# Patient Record
Sex: Female | Born: 1940 | Race: White | Hispanic: No | Marital: Married | Smoking: Never smoker
Health system: Southern US, Community
[De-identification: ages and names within clinical notes are randomized; demographics above are authoritative.]

## PROBLEM LIST (undated history)

## (undated) DIAGNOSIS — K635 Polyp of colon: Secondary | ICD-10-CM

## (undated) DIAGNOSIS — I7300 Raynaud's syndrome without gangrene: Secondary | ICD-10-CM

## (undated) DIAGNOSIS — T7840XA Allergy, unspecified, initial encounter: Secondary | ICD-10-CM

## (undated) DIAGNOSIS — E079 Disorder of thyroid, unspecified: Secondary | ICD-10-CM

## (undated) DIAGNOSIS — L409 Psoriasis, unspecified: Secondary | ICD-10-CM

## (undated) DIAGNOSIS — E669 Obesity, unspecified: Secondary | ICD-10-CM

## (undated) DIAGNOSIS — M1990 Unspecified osteoarthritis, unspecified site: Secondary | ICD-10-CM

## (undated) HISTORY — DX: Disorder of thyroid, unspecified: E07.9

## (undated) HISTORY — DX: Obesity, unspecified: E66.9

## (undated) HISTORY — DX: Allergy, unspecified, initial encounter: T78.40XA

## (undated) HISTORY — PX: CHOLECYSTECTOMY: SHX55

## (undated) HISTORY — DX: Polyp of colon: K63.5

## (undated) HISTORY — PX: OTHER SURGICAL HISTORY: SHX169

## (undated) HISTORY — DX: Raynaud's syndrome without gangrene: I73.00

## (undated) HISTORY — DX: Psoriasis, unspecified: L40.9

---

## 1983-11-14 HISTORY — PX: ABDOMINAL HYSTERECTOMY: SHX81

## 2000-09-21 ENCOUNTER — Encounter: Payer: Self-pay | Admitting: Family Medicine

## 2000-09-21 ENCOUNTER — Encounter: Admission: RE | Admit: 2000-09-21 | Discharge: 2000-09-21 | Payer: Self-pay | Admitting: Family Medicine

## 2000-09-25 LAB — HM PAP SMEAR: HM Pap smear: NORMAL

## 2002-02-07 ENCOUNTER — Encounter: Admission: RE | Admit: 2002-02-07 | Discharge: 2002-02-07 | Payer: Self-pay | Admitting: Family Medicine

## 2002-02-07 ENCOUNTER — Encounter: Payer: Self-pay | Admitting: Family Medicine

## 2004-01-13 ENCOUNTER — Encounter: Admission: RE | Admit: 2004-01-13 | Discharge: 2004-01-13 | Payer: Self-pay | Admitting: Internal Medicine

## 2005-10-31 ENCOUNTER — Encounter: Admission: RE | Admit: 2005-10-31 | Discharge: 2005-10-31 | Payer: Self-pay | Admitting: Internal Medicine

## 2005-11-07 ENCOUNTER — Encounter (INDEPENDENT_AMBULATORY_CARE_PROVIDER_SITE_OTHER): Payer: Self-pay | Admitting: Specialist

## 2005-11-07 ENCOUNTER — Other Ambulatory Visit: Admission: RE | Admit: 2005-11-07 | Discharge: 2005-11-07 | Payer: Self-pay | Admitting: Interventional Radiology

## 2005-11-07 ENCOUNTER — Encounter: Admission: RE | Admit: 2005-11-07 | Discharge: 2005-11-07 | Payer: Self-pay | Admitting: Internal Medicine

## 2009-03-04 ENCOUNTER — Ambulatory Visit: Payer: Self-pay | Admitting: Family Medicine

## 2009-03-09 ENCOUNTER — Ambulatory Visit: Payer: Self-pay | Admitting: Family Medicine

## 2009-04-01 ENCOUNTER — Ambulatory Visit: Payer: Self-pay | Admitting: Family Medicine

## 2009-04-06 ENCOUNTER — Ambulatory Visit: Payer: Self-pay | Admitting: Family Medicine

## 2009-06-22 ENCOUNTER — Ambulatory Visit: Payer: Self-pay | Admitting: Family Medicine

## 2009-06-29 LAB — HM MAMMOGRAPHY: HM Mammogram: NEGATIVE

## 2009-08-17 ENCOUNTER — Ambulatory Visit: Payer: Self-pay | Admitting: Family Medicine

## 2009-08-26 LAB — HM COLONOSCOPY

## 2009-11-16 ENCOUNTER — Ambulatory Visit: Payer: Self-pay | Admitting: Family Medicine

## 2010-03-09 ENCOUNTER — Encounter: Admission: RE | Admit: 2010-03-09 | Discharge: 2010-03-09 | Payer: Self-pay | Admitting: Family Medicine

## 2010-03-09 ENCOUNTER — Ambulatory Visit: Payer: Self-pay | Admitting: Family Medicine

## 2010-04-12 ENCOUNTER — Ambulatory Visit: Payer: Self-pay | Admitting: Family Medicine

## 2010-08-04 ENCOUNTER — Ambulatory Visit: Payer: Self-pay | Admitting: Family Medicine

## 2010-08-30 ENCOUNTER — Ambulatory Visit: Payer: Self-pay | Admitting: Family Medicine

## 2010-09-13 ENCOUNTER — Ambulatory Visit: Payer: Self-pay | Admitting: Family Medicine

## 2010-11-29 ENCOUNTER — Ambulatory Visit
Admission: RE | Admit: 2010-11-29 | Discharge: 2010-11-29 | Payer: Self-pay | Source: Home / Self Care | Attending: Family Medicine | Admitting: Family Medicine

## 2011-05-24 ENCOUNTER — Encounter: Payer: Self-pay | Admitting: Family Medicine

## 2011-05-25 ENCOUNTER — Encounter: Payer: Self-pay | Admitting: Family Medicine

## 2011-05-25 ENCOUNTER — Ambulatory Visit (INDEPENDENT_AMBULATORY_CARE_PROVIDER_SITE_OTHER): Payer: Medicare Other | Admitting: Family Medicine

## 2011-05-25 VITALS — BP 114/80 | HR 82 | Wt 196.0 lb

## 2011-05-25 DIAGNOSIS — H6092 Unspecified otitis externa, left ear: Secondary | ICD-10-CM

## 2011-05-25 DIAGNOSIS — L409 Psoriasis, unspecified: Secondary | ICD-10-CM | POA: Insufficient documentation

## 2011-05-25 DIAGNOSIS — H60399 Other infective otitis externa, unspecified ear: Secondary | ICD-10-CM

## 2011-05-25 DIAGNOSIS — I7300 Raynaud's syndrome without gangrene: Secondary | ICD-10-CM | POA: Insufficient documentation

## 2011-05-25 DIAGNOSIS — J301 Allergic rhinitis due to pollen: Secondary | ICD-10-CM | POA: Insufficient documentation

## 2011-05-25 DIAGNOSIS — E039 Hypothyroidism, unspecified: Secondary | ICD-10-CM | POA: Insufficient documentation

## 2011-05-25 MED ORDER — NEOMYCIN-POLYMYXIN-HC 3.5-10000-1 OT SUSP: 3.0000 [drp] | Freq: Three times a day (TID) | OTIC | Status: AC

## 2011-05-25 NOTE — Patient Instructions (Signed)
Use this medicine for least a week. If you have trouble later on you can use it to help prevent or use and alcohol vinegar combination

## 2011-05-25 NOTE — Progress Notes (Signed)
  Subjective:    Patient ID: Kathy Freeman, female    DOB: [birth year], 70 y.o.   MRN: [id number]  HPI She is here for consult concerning difficulty with left ear discomfort. She does use a hearing aid in this which does fit in the canal. She has noted some drainage and apparently has also seen some blood.   Review of Systems     Objective:   Physical Exam The left canal is swollen with debris present. The TM could not be seen.       Assessment & Plan:  Left otitis externa I will treat with Cortisporin otic. She will do this for the next week. Encouraged her to use a combination of alcohol and vinegar in the future to help keep her ear clean and not use Q-tips.

## 2011-06-11 ENCOUNTER — Other Ambulatory Visit: Payer: Self-pay | Admitting: Family Medicine

## 2011-07-19 ENCOUNTER — Telehealth: Payer: Self-pay | Admitting: Family Medicine

## 2011-07-19 NOTE — Telephone Encounter (Signed)
Okay to refer? 

## 2011-07-19 NOTE — Telephone Encounter (Signed)
PT WALKED IN AND SAID SHE WAYS HAVING ISSUES SEEING OUT OF HER EYE JUST WENT TO [facility] AND WASN'T HAPPY WITH WHAT THEY SAID AND WANTS A REFERRAL TO THE SAME EYE DOC WE SENT HER HUSBAND CHARLES TO.  SHE SAID IT WAS ON [address]  LOOKED IN PT CHART AND SAW REFERRAL BUT WAS UNABLE TO DETERMINE EXACTLY WHERE WE SENT HIM PLEASE CALL PT WITH REFERRAL

## 2011-07-20 ENCOUNTER — Telehealth: Payer: Self-pay

## 2011-07-20 NOTE — Telephone Encounter (Signed)
Called Dr.Gould to get pt eye exam has appt Monday 17 at 11 am  Called pt to inform

## 2011-09-12 ENCOUNTER — Other Ambulatory Visit: Payer: Self-pay | Admitting: Internal Medicine

## 2011-09-12 ENCOUNTER — Other Ambulatory Visit: Payer: Self-pay | Admitting: Family Medicine

## 2011-09-12 ENCOUNTER — Telehealth: Payer: Self-pay | Admitting: Family Medicine

## 2011-09-12 MED ORDER — LEVOTHYROXINE SODIUM 100 MCG PO TABS: 100.0000 ug | ORAL_TABLET | Freq: Every day | ORAL | Status: DC

## 2011-09-12 NOTE — Telephone Encounter (Signed)
Note send.

## 2011-09-12 NOTE — Telephone Encounter (Signed)
Is this okay to refill? 

## 2011-09-12 NOTE — Telephone Encounter (Signed)
Renew her Levoxyl for one month and have her come in for medication check

## 2011-09-12 NOTE — Telephone Encounter (Signed)
Has an appt tomorrow for follow up on med check

## 2011-09-13 ENCOUNTER — Encounter: Payer: Self-pay | Admitting: Family Medicine

## 2011-09-13 ENCOUNTER — Ambulatory Visit (INDEPENDENT_AMBULATORY_CARE_PROVIDER_SITE_OTHER): Payer: Medicare Other | Admitting: Family Medicine

## 2011-09-13 DIAGNOSIS — E039 Hypothyroidism, unspecified: Secondary | ICD-10-CM

## 2011-09-13 DIAGNOSIS — I7300 Raynaud's syndrome without gangrene: Secondary | ICD-10-CM

## 2011-09-13 DIAGNOSIS — Z79899 Other long term (current) drug therapy: Secondary | ICD-10-CM

## 2011-09-13 DIAGNOSIS — M1990 Unspecified osteoarthritis, unspecified site: Secondary | ICD-10-CM

## 2011-09-13 DIAGNOSIS — M129 Arthropathy, unspecified: Secondary | ICD-10-CM

## 2011-09-13 LAB — CBC WITH DIFFERENTIAL/PLATELET
Basophils Absolute: 0 10*3/uL (ref 0.0–0.1)
Basophils Relative: 1 % (ref 0–1)
Eosinophils Absolute: 0.3 10*3/uL (ref 0.0–0.7)
Eosinophils Relative: 4 % (ref 0–5)
HCT: 43.9 % (ref 36.0–46.0)
Hemoglobin: 13.8 g/dL (ref 12.0–15.0)
Lymphocytes Relative: 25 % (ref 12–46)
Lymphs Abs: 1.6 10*3/uL (ref 0.7–4.0)
MCH: 28.1 pg (ref 26.0–34.0)
MCHC: 31.4 g/dL (ref 30.0–36.0)
MCV: 89.4 fL (ref 78.0–100.0)
Monocytes Absolute: 0.5 10*3/uL (ref 0.1–1.0)
Monocytes Relative: 8 % (ref 3–12)
Neutro Abs: 4 10*3/uL (ref 1.7–7.7)
Neutrophils Relative %: 62 % (ref 43–77)
Platelets: 302 10*3/uL (ref 150–400)
RBC: 4.91 MIL/uL (ref 3.87–5.11)
RDW: 13.8 % (ref 11.5–15.5)
WBC: 6.4 10*3/uL (ref 4.0–10.5)

## 2011-09-13 LAB — COMPREHENSIVE METABOLIC PANEL
ALT: 11 U/L (ref 0–35)
AST: 18 U/L (ref 0–37)
Albumin: 4.4 g/dL (ref 3.5–5.2)
Alkaline Phosphatase: 98 U/L (ref 39–117)
BUN: 20 mg/dL (ref 6–23)
CO2: 27 mEq/L (ref 19–32)
Calcium: 9.4 mg/dL (ref 8.4–10.5)
Chloride: 103 mEq/L (ref 96–112)
Creat: 0.76 mg/dL (ref 0.50–1.10)
Glucose, Bld: 65 mg/dL — ABNORMAL LOW (ref 70–99)
Potassium: 5.2 mEq/L (ref 3.5–5.3)
Sodium: 140 mEq/L (ref 135–145)
Total Bilirubin: 0.5 mg/dL (ref 0.3–1.2)
Total Protein: 6.9 g/dL (ref 6.0–8.3)

## 2011-09-13 LAB — TSH: TSH: 0.849 u[IU]/mL (ref 0.350–4.500)

## 2011-09-13 NOTE — Progress Notes (Signed)
  Subjective:    Patient ID: Kathy Freeman, female    DOB: [birth year], 70 y.o.   MRN: [id number]  HPI She is here for medication recheck. She has not had blood work done in over a year. She does have hypothyroidism continues on Levoxyl. He has had no skin, hair changes, cold intolerance or constipation. Her allergies are under good control. She does take Celebrex daily for her arthritis and does get followup with Dr. Ethelene Hal for this. She is not having any difficulty with her Raynaud's disease. She has had a flu shot  Review of Systems     Objective:   Physical Exam alert and in no distress. Tympanic membranes and canals are normal. Throat is clear. Tonsils are normal. Neck is supple without adenopathy or thyromegaly. Cardiac exam shows a regular sinus rhythm without murmurs or gallops. Lungs are clear to auscultation. Skin normal. DTRs normal.        Assessment & Plan:   1. Hypothyroid  TSH  2. Arthritis    3. Encounter for long-term (current) use of other medications  TSH, CBC with Differential, Comprehensive metabolic panel

## 2011-09-14 MED ORDER — LEVOTHYROXINE SODIUM 100 MCG PO TABS: 100.0000 ug | ORAL_TABLET | Freq: Every day | ORAL | Status: DC

## 2011-09-14 NOTE — Progress Notes (Signed)
Addended by: Ronnald Nian on: 09/14/2011 08:34 AM   Modules accepted: Orders

## 2011-10-26 ENCOUNTER — Telehealth: Payer: Self-pay | Admitting: Family Medicine

## 2011-10-26 NOTE — Telephone Encounter (Signed)
The blood work was done in November. Check with Tammy on this.

## 2011-11-16 ENCOUNTER — Telehealth: Payer: Self-pay | Admitting: Internal Medicine

## 2011-11-16 NOTE — Telephone Encounter (Signed)
Was not sent to Dr. Ethelene Hal so i notified pt that i will fax over to Dr. Ethelene Hal [phone]

## 2012-03-06 ENCOUNTER — Telehealth: Payer: Self-pay | Admitting: Internal Medicine

## 2012-03-06 MED ORDER — LEVOTHYROXINE SODIUM 100 MCG PO TABS: 100.0000 ug | ORAL_TABLET | Freq: Every day | ORAL | Status: DC

## 2012-03-06 NOTE — Telephone Encounter (Signed)
Switch to Synthroid

## 2012-03-06 NOTE — Telephone Encounter (Signed)
levoxyl is not unavailable and per Dr. Susann Givens he wants her switched to synthroid . I did sent it to pharmacy #[id number] with 7 refills.

## 2012-03-06 NOTE — Telephone Encounter (Signed)
Pt informed

## 2012-03-26 ENCOUNTER — Ambulatory Visit (INDEPENDENT_AMBULATORY_CARE_PROVIDER_SITE_OTHER): Payer: Medicare Other | Admitting: Family Medicine

## 2012-03-26 ENCOUNTER — Encounter: Payer: Self-pay | Admitting: Family Medicine

## 2012-03-26 VITALS — BP 116/70 | HR 80 | Ht 64.0 in | Wt 205.0 lb

## 2012-03-26 DIAGNOSIS — Z13820 Encounter for screening for osteoporosis: Secondary | ICD-10-CM

## 2012-03-26 DIAGNOSIS — M129 Arthropathy, unspecified: Secondary | ICD-10-CM

## 2012-03-26 DIAGNOSIS — Z13220 Encounter for screening for lipoid disorders: Secondary | ICD-10-CM

## 2012-03-26 DIAGNOSIS — L409 Psoriasis, unspecified: Secondary | ICD-10-CM

## 2012-03-26 DIAGNOSIS — E669 Obesity, unspecified: Secondary | ICD-10-CM

## 2012-03-26 DIAGNOSIS — Z23 Encounter for immunization: Secondary | ICD-10-CM

## 2012-03-26 DIAGNOSIS — M1990 Unspecified osteoarthritis, unspecified site: Secondary | ICD-10-CM

## 2012-03-26 DIAGNOSIS — Z79899 Other long term (current) drug therapy: Secondary | ICD-10-CM

## 2012-03-26 DIAGNOSIS — Z1211 Encounter for screening for malignant neoplasm of colon: Secondary | ICD-10-CM

## 2012-03-26 DIAGNOSIS — E039 Hypothyroidism, unspecified: Secondary | ICD-10-CM

## 2012-03-26 DIAGNOSIS — J302 Other seasonal allergic rhinitis: Secondary | ICD-10-CM

## 2012-03-26 DIAGNOSIS — L408 Other psoriasis: Secondary | ICD-10-CM

## 2012-03-26 DIAGNOSIS — J309 Allergic rhinitis, unspecified: Secondary | ICD-10-CM

## 2012-03-26 LAB — CBC WITH DIFFERENTIAL/PLATELET
Basophils Absolute: 0 10*3/uL (ref 0.0–0.1)
Basophils Relative: 1 % (ref 0–1)
Eosinophils Absolute: 0.2 10*3/uL (ref 0.0–0.7)
Eosinophils Relative: 3 % (ref 0–5)
HCT: 42.5 % (ref 36.0–46.0)
Hemoglobin: 13.5 g/dL (ref 12.0–15.0)
Lymphocytes Relative: 26 % (ref 12–46)
Lymphs Abs: 1.5 10*3/uL (ref 0.7–4.0)
MCH: 28.2 pg (ref 26.0–34.0)
MCHC: 31.8 g/dL (ref 30.0–36.0)
MCV: 88.7 fL (ref 78.0–100.0)
Monocytes Absolute: 0.4 10*3/uL (ref 0.1–1.0)
Monocytes Relative: 7 % (ref 3–12)
Neutro Abs: 3.8 10*3/uL (ref 1.7–7.7)
Neutrophils Relative %: 65 % (ref 43–77)
Platelets: 308 10*3/uL (ref 150–400)
RBC: 4.79 MIL/uL (ref 3.87–5.11)
RDW: 13.8 % (ref 11.5–15.5)
WBC: 5.9 10*3/uL (ref 4.0–10.5)

## 2012-03-26 LAB — COMPREHENSIVE METABOLIC PANEL
ALT: 11 U/L (ref 0–35)
AST: 19 U/L (ref 0–37)
Albumin: 4.3 g/dL (ref 3.5–5.2)
Alkaline Phosphatase: 90 U/L (ref 39–117)
BUN: 21 mg/dL (ref 6–23)
CO2: 30 mEq/L (ref 19–32)
Calcium: 9.5 mg/dL (ref 8.4–10.5)
Chloride: 104 mEq/L (ref 96–112)
Creat: 0.79 mg/dL (ref 0.50–1.10)
Glucose, Bld: 85 mg/dL (ref 70–99)
Potassium: 4.8 mEq/L (ref 3.5–5.3)
Sodium: 141 mEq/L (ref 135–145)
Total Bilirubin: 0.7 mg/dL (ref 0.3–1.2)
Total Protein: 6.6 g/dL (ref 6.0–8.3)

## 2012-03-26 LAB — LIPID PANEL
Cholesterol: 218 mg/dL — ABNORMAL HIGH (ref 0–200)
HDL: 63 mg/dL (ref >39)
LDL Cholesterol: 140 mg/dL — ABNORMAL HIGH (ref 0–99)
Total CHOL/HDL Ratio: 3.5 Ratio
Triglycerides: 74 mg/dL (ref <150)
VLDL: 15 mg/dL (ref 0–40)

## 2012-03-26 LAB — HEMOCCULT GUIAC POC 1CARD (OFFICE)

## 2012-03-26 NOTE — Progress Notes (Signed)
Addended by: Ronnald Nian on: 03/26/2012 12:14 PM   Modules accepted: Orders

## 2012-03-26 NOTE — Progress Notes (Signed)
  Subjective:    Patient ID: Kathy Freeman, female    DOB: 07-09-1941, 71 y.o.   MRN: [id number]  HPI  she is here for an annual wellness visit. Her medical record was reviewed. Social and family history was reviewed. She keeps herself quite active and has no symptoms of depression. She has not fallen and has no concerns over falling. Her immunizations were reviewed and will be updated. She has not had a DEXA scan. Her last colonoscopy was 2010. Her last Pap was 1996. She continues to work and is having no difficulty with ADLs. She does have arthritis and uses one Celebrex per day for this. Her allergies seem to be under good control with Zyrtec. She continues on Synthroid 0.1 mg and again is having no trouble with that. She does complain of a rash on the medial aspect of her right ankle.  Review of Systems     Objective:   Physical Exam alert and in no distress. Tympanic membranes and canals are normal. Throat is clear. Tonsils are normal. Neck is supple without adenopathy or thyromegaly. Cardiac exam shows a regular sinus rhythm without murmurs or gallops. Lungs are clear to auscultation. Abdominal exam shows no masses or tenderness with normal bowel sounds.        Assessment & Plan:   1. Encounter for long-term (current) use of other medications  DG Bone Density, CBC with Differential, Comprehensive metabolic panel, Lipid panel  2. Screening for osteoporosis    3. Screening for lipoid disorders  Lipid panel  4. Hypothyroid  TSH  5. Psoriasis    6. Arthritis    7. Obesity (BMI 35.0-39.9 without comorbidity)  CBC with Differential, Comprehensive metabolic panel  8. Allergic rhinitis, seasonal     recommend steroid for her skin lesion. Discussed the fact that she needs to get this under control and keep it under control. Also followup on her thyroid , DEXA scan and lipid panel. Since she is doing well on one Celebrex a day, continue on that dosing.

## 2012-03-27 LAB — TSH: TSH: 1.185 u[IU]/mL (ref 0.350–4.500)

## 2012-04-10 ENCOUNTER — Other Ambulatory Visit: Payer: Medicare Other

## 2012-04-11 ENCOUNTER — Emergency Department (INDEPENDENT_AMBULATORY_CARE_PROVIDER_SITE_OTHER): Payer: Worker's Compensation

## 2012-04-11 ENCOUNTER — Emergency Department (HOSPITAL_COMMUNITY): Payer: Self-pay

## 2012-04-11 ENCOUNTER — Encounter (HOSPITAL_COMMUNITY): Payer: Self-pay | Admitting: Cardiology

## 2012-04-11 ENCOUNTER — Emergency Department (INDEPENDENT_AMBULATORY_CARE_PROVIDER_SITE_OTHER)
Admission: EM | Admit: 2012-04-11 | Discharge: 2012-04-11 | Disposition: A | Discharge status: Home / Self Care | Payer: Worker's Compensation | Source: Home / Self Care | Attending: Family Medicine | Admitting: Family Medicine

## 2012-04-11 DIAGNOSIS — S63279A Dislocation of unspecified interphalangeal joint of unspecified finger, initial encounter: Secondary | ICD-10-CM

## 2012-04-11 HISTORY — DX: Unspecified osteoarthritis, unspecified site: M19.90

## 2012-04-11 MED ORDER — HYDROCODONE-ACETAMINOPHEN 5-325 MG PO TABS: 1.0000 | ORAL_TABLET | Freq: Four times a day (QID) | ORAL | Status: AC | PRN

## 2012-04-11 NOTE — ED Provider Notes (Signed)
History     CSN: [id number]  Arrival date & time 04/11/12  1818   First MD Initiated Contact with Patient 04/11/12 1820      Chief Complaint  Patient presents with  . Finger Injury    (Consider location/radiation/quality/duration/timing/severity/associated sxs/prior treatment) Patient is a 71 y.o. female presenting with hand injury. The history is provided by the patient and the spouse.  Hand Injury  The incident occurred 3 to 5 hours ago. The incident occurred at work. The injury mechanism was a fall. The pain is present in the left fingers. The quality of the pain is described as aching. The pain is moderate. She reports no foreign bodies present. The symptoms are aggravated by movement.    Past Medical History  Diagnosis Date  . Allergy     RHINITIS  . Obesity   . Raynaud disease   . Psoriasis   . Hemorrhoids   . Colonic polyp   . Thyroid disease     HYPOTHYROID  . Arthritis     Past Surgical History  Procedure Date  . Abdominal hysterectomy 1985  . Cholecystectomy     Family History  Problem Relation Age of Onset  . Arthritis Mother   . Diabetes Mother   . Heart disease Mother   . Arthritis Father   . Cancer Father   . Diabetes Father   . Hypertension Father   . Diabetes Sister   . Heart disease Sister   . Hypertension Sister   . Diabetes Brother   . Mental illness Brother   . Hypertension Brother     History  Substance Use Topics  . Smoking status: Never Smoker   . Smokeless tobacco: Never Used  . Alcohol Use: No    OB History    Grav Para Term Preterm Abortions TAB SAB Ect Mult Living                  Review of Systems  Constitutional: Negative.   Musculoskeletal: Positive for joint swelling.  Skin: Positive for wound.    Allergies  Asa and Sulfa antibiotics  Home Medications   Current Outpatient Rx  Name Route Sig Dispense Refill  . CELECOXIB 200 MG PO CAPS Oral Take 200 mg by mouth daily.     Marland Kitchen CETIRIZINE HCL 10 MG PO TABS  Oral Take 10 mg by mouth daily.      Marland Kitchen LEVOTHYROXINE SODIUM 100 MCG PO TABS Oral Take 1 tablet (100 mcg total) by mouth daily. 30 tablet 7  . MULTI-VITAMIN/MINERALS PO TABS Oral Take 1 tablet by mouth daily.      . TRAMADOL HCL ER 200 MG PO TB24 Oral Take 50 mg by mouth daily.     Marland Kitchen HYDROCODONE-ACETAMINOPHEN 5-325 MG PO TABS Oral Take 1 tablet by mouth every 6 (six) hours as needed for pain. 15 tablet 0    BP 120/55  Pulse 109  Temp(Src) 98.4 F (36.9 C) (Oral)  Resp 16  SpO2 98%  Physical Exam  Nursing note and vitals reviewed. Constitutional: She is oriented to person, place, and time. She appears well-developed and well-nourished.  Musculoskeletal: She exhibits tenderness.       Hands: Neurological: She is alert and oriented to person, place, and time.  Skin: Skin is warm and dry.    ED Course  NERVE BLOCK Performed by: Linna Hoff Authorized by: Bradd Canary D Body area: upper extremity Nerve: digital Patient sedated: no Preparation: Patient was prepped and draped in the  usual sterile fashion. Patient position: supine Needle gauge: 22 G Local anesthetic: bupivacaine 0.25% without epinephrine Outcome: pain improved Patient tolerance: Patient tolerated the procedure well with no immediate complications.  ORTHOPEDIC INJURY TREATMENT Date/Time: 04/11/2012 7:12 PM Performed by: Linna Hoff Authorized by: Bradd Canary D Consent: Verbal consent obtained. Consent given by: patient and spouse Patient understanding: patient states understanding of the procedure being performed Injury location: hand Injury type: dislocation Pre-procedure neurovascular assessment: neurovascularly intact Pre-procedure distal perfusion: normal Pre-procedure neurological function: normal Pre-procedure range of motion: reduced Local anesthesia used: yes Anesthesia: digital block Local anesthetic: bupivacaine 0.5% without epinephrine Patient sedated: no Manipulation performed:  yes Reduction successful: yes X-ray confirmed reduction: yes Immobilization: splint Supplies used: aluminum splint Post-procedure neurovascular assessment: post-procedure neurovascularly intact Post-procedure distal perfusion: normal Post-procedure neurological function: normal Post-procedure range of motion: improved Patient tolerance: Patient tolerated the procedure well with no immediate complications.   (including critical care time)  Labs Reviewed - No data to display Dg Finger Middle Left  04/11/2012  *RADIOLOGY REPORT*  Clinical Data: Postreduction.  LEFT MIDDLE FINGER 2+V  Comparison: None.  Findings: The PIP joint dislocation has been successfully reduced.  Small chip fractures are seen dorsally and ventrally in relation to the distal aspect of the proximal phalanx (arrows).  Soft tissue swelling is noted.  IMPRESSION: PIP dislocation has been reduced.  Original Report Authenticated By: Elsie Stain, M.D.   Dg Finger Middle Left  04/11/2012  *RADIOLOGY REPORT*  Clinical Data: Fall, soft tissue injury  LEFT MIDDLE FINGER 2+V  Comparison: None.  Findings: There is dislocation of the proximal interphalangeal joint with ulnar subluxation of the middle phalanx.  No fracture is identified but the proximal middle phalanx overlap which could obscure a small avulsion fracture.  IMPRESSION: Dislocation of the proximal interphalangeal joint of the third digit.  Recommend repeat radiograph once reduced to re evaluate for fracture.  Original Report Authenticated By: Genevive Bi, M.D.     1. Dislocation, finger, interphalangeal joint, initial encounter       MDM  X-rays reviewed and report per radiologist.        Linna Hoff, MD 04/11/12 [phone]

## 2012-04-11 NOTE — Discharge Instructions (Signed)
Splint for comfort, ice for swelling, see dr. Rayburn Ma next week.

## 2012-04-11 NOTE — ED Notes (Signed)
Pt stumbled and fell about 530pm today and injuried left middle finger. Finger noted to be deformed and laceration to the anterior side of the finger. Bleeding controlled at this time.

## 2012-04-25 ENCOUNTER — Other Ambulatory Visit: Payer: Medicare Other

## 2012-06-18 ENCOUNTER — Telehealth: Payer: Self-pay | Admitting: Family Medicine

## 2012-06-18 NOTE — Telephone Encounter (Signed)
Pt has appt 8/7  

## 2012-06-18 NOTE — Telephone Encounter (Signed)
Have her come here first.

## 2012-06-19 ENCOUNTER — Encounter: Payer: Self-pay | Admitting: Family Medicine

## 2012-06-19 ENCOUNTER — Ambulatory Visit (INDEPENDENT_AMBULATORY_CARE_PROVIDER_SITE_OTHER): Payer: Medicare Other | Admitting: Family Medicine

## 2012-06-19 VITALS — BP 130/86 | HR 78 | Wt 213.0 lb

## 2012-06-19 DIAGNOSIS — H9319 Tinnitus, unspecified ear: Secondary | ICD-10-CM

## 2012-06-19 DIAGNOSIS — H60399 Other infective otitis externa, unspecified ear: Secondary | ICD-10-CM

## 2012-06-19 DIAGNOSIS — H7290 Unspecified perforation of tympanic membrane, unspecified ear: Secondary | ICD-10-CM

## 2012-06-19 DIAGNOSIS — H6092 Unspecified otitis externa, left ear: Secondary | ICD-10-CM

## 2012-06-19 MED ORDER — CIPROFLOXACIN HCL 500 MG PO TABS: 500.0000 mg | ORAL_TABLET | Freq: Two times a day (BID) | ORAL | Status: AC

## 2012-06-19 NOTE — Patient Instructions (Addendum)
If the drainage has not stopped in 10 days when you finish the antibiotic give me a call and I will refer you back to Dr. Ezzard Standing

## 2012-06-19 NOTE — Progress Notes (Signed)
  Subjective:    Patient ID: Kathy Freeman, female    DOB: 20-Apr-1941, 71 y.o.   MRN: [id number]  HPI She is having difficulty with ringing in ears as well as discharge. She has been using drops in ears for the last several days but has not noted any improvement. She was seen in the past by ENT. 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   Review of Systems     Objective:   Physical Exam Alert and in no distress. Left canal does have fluid in it and is swollen. The TM was not seen well. The right canal and TM are normal.       Assessment & Plan:   1. Otitis externa of left ear  ciprofloxacin (CIPRO) 500 MG tablet  2. Perforation of tympanic membrane    3. Tinnitus    If the drainage has not stopped in 10 days when you finish the antibiotic give me a call and I will refer you back to Dr. Ezzard Standing

## 2012-07-01 ENCOUNTER — Telehealth: Payer: Self-pay | Admitting: Internal Medicine

## 2012-07-01 NOTE — Telephone Encounter (Signed)
Go ahead and refer her to whoever she has seen in the past

## 2012-07-01 NOTE — Telephone Encounter (Signed)
Left message for pt to call me back with the name of the ent she had seen so I can get appt for her

## 2012-07-02 NOTE — Telephone Encounter (Signed)
Pt informed she has appt with Dr.Newman 9 am AUG,21 [phone] ENT

## 2012-10-21 ENCOUNTER — Other Ambulatory Visit: Payer: Self-pay | Admitting: Family Medicine

## 2012-12-17 ENCOUNTER — Other Ambulatory Visit: Payer: Self-pay | Admitting: Family Medicine

## 2013-02-09 ENCOUNTER — Other Ambulatory Visit: Payer: Self-pay | Admitting: Family Medicine

## 2013-04-05 ENCOUNTER — Other Ambulatory Visit: Payer: Self-pay | Admitting: Family Medicine

## 2013-04-14 ENCOUNTER — Encounter: Payer: Self-pay | Admitting: Family Medicine

## 2013-04-14 ENCOUNTER — Ambulatory Visit (INDEPENDENT_AMBULATORY_CARE_PROVIDER_SITE_OTHER): Payer: Medicare Other | Admitting: Family Medicine

## 2013-04-14 VITALS — BP 130/80 | HR 83 | Wt 208.0 lb

## 2013-04-14 DIAGNOSIS — E039 Hypothyroidism, unspecified: Secondary | ICD-10-CM

## 2013-04-14 DIAGNOSIS — M129 Arthropathy, unspecified: Secondary | ICD-10-CM

## 2013-04-14 DIAGNOSIS — L409 Psoriasis, unspecified: Secondary | ICD-10-CM

## 2013-04-14 DIAGNOSIS — M1990 Unspecified osteoarthritis, unspecified site: Secondary | ICD-10-CM

## 2013-04-14 DIAGNOSIS — J301 Allergic rhinitis due to pollen: Secondary | ICD-10-CM

## 2013-04-14 DIAGNOSIS — L408 Other psoriasis: Secondary | ICD-10-CM

## 2013-04-14 DIAGNOSIS — Z79899 Other long term (current) drug therapy: Secondary | ICD-10-CM

## 2013-04-14 LAB — CBC WITH DIFFERENTIAL/PLATELET
Basophils Absolute: 0 10*3/uL (ref 0.0–0.1)
Basophils Relative: 1 % (ref 0–1)
Eosinophils Absolute: 0.3 10*3/uL (ref 0.0–0.7)
Eosinophils Relative: 4 % (ref 0–5)
HCT: 38.9 % (ref 36.0–46.0)
Hemoglobin: 13.3 g/dL (ref 12.0–15.0)
Lymphocytes Relative: 22 % (ref 12–46)
Lymphs Abs: 1.4 10*3/uL (ref 0.7–4.0)
MCH: 28.4 pg (ref 26.0–34.0)
MCHC: 34.2 g/dL (ref 30.0–36.0)
MCV: 82.9 fL (ref 78.0–100.0)
Monocytes Absolute: 0.5 10*3/uL (ref 0.1–1.0)
Monocytes Relative: 8 % (ref 3–12)
Neutro Abs: 4.2 10*3/uL (ref 1.7–7.7)
Neutrophils Relative %: 65 % (ref 43–77)
Platelets: 307 10*3/uL (ref 150–400)
RBC: 4.69 MIL/uL (ref 3.87–5.11)
RDW: 13.9 % (ref 11.5–15.5)
WBC: 6.4 10*3/uL (ref 4.0–10.5)

## 2013-04-14 LAB — COMPREHENSIVE METABOLIC PANEL
ALT: 14 U/L (ref 0–35)
AST: 19 U/L (ref 0–37)
Albumin: 4.1 g/dL (ref 3.5–5.2)
Alkaline Phosphatase: 94 U/L (ref 39–117)
BUN: 23 mg/dL (ref 6–23)
CO2: 27 mEq/L (ref 19–32)
Calcium: 9.2 mg/dL (ref 8.4–10.5)
Chloride: 108 mEq/L (ref 96–112)
Creat: 0.74 mg/dL (ref 0.50–1.10)
Glucose, Bld: 80 mg/dL (ref 70–99)
Potassium: 4.6 mEq/L (ref 3.5–5.3)
Sodium: 140 mEq/L (ref 135–145)
Total Bilirubin: 0.8 mg/dL (ref 0.3–1.2)
Total Protein: 6.9 g/dL (ref 6.0–8.3)

## 2013-04-14 LAB — LIPID PANEL
Cholesterol: 205 mg/dL — ABNORMAL HIGH (ref 0–200)
HDL: 60 mg/dL (ref >39)
LDL Cholesterol: 133 mg/dL — ABNORMAL HIGH (ref 0–99)
Total CHOL/HDL Ratio: 3.4 Ratio
Triglycerides: 62 mg/dL (ref <150)
VLDL: 12 mg/dL (ref 0–40)

## 2013-04-14 NOTE — Progress Notes (Signed)
  Subjective:    Patient ID: Kathy Freeman, female    DOB: 25-Jul-1941, 72 y.o.   MRN: [id number]  HPI She is here for medication check. She has not had any blood work done in quite some time. She does take Celebrex on a regular basis for treatment of arthritic symptoms. She is followed by Dr. Ethelene Hal for that. She has no other concerns or complaints. Social history was reviewed.   Review of Systems     Objective:   Physical Exam alert and in no distress. Tympanic membranes and canals are normal. Throat is clear. Tonsils are normal. Neck is supple without adenopathy or thyromegaly. Cardiac exam shows a regular sinus rhythm without murmurs or gallops. Lungs are clear to auscultation.        Assessment & Plan:  Arthritis - Plan: CBC with Differential, Comprehensive metabolic panel  Psoriasis  Allergic rhinitis due to pollen  Hypothyroid  Encounter for long-term (current) use of other medications - Plan: CBC with Differential, Comprehensive metabolic panel, Lipid panel

## 2013-04-15 NOTE — Progress Notes (Signed)
Quick Note:  Left pt message on cell# labs look good ______

## 2013-06-06 ENCOUNTER — Other Ambulatory Visit: Payer: Self-pay | Admitting: Family Medicine

## 2013-12-15 ENCOUNTER — Other Ambulatory Visit: Payer: Self-pay | Admitting: Family Medicine

## 2014-05-26 ENCOUNTER — Encounter: Payer: Self-pay | Admitting: Family Medicine

## 2014-05-26 ENCOUNTER — Ambulatory Visit (INDEPENDENT_AMBULATORY_CARE_PROVIDER_SITE_OTHER): Payer: Medicare Other | Admitting: Family Medicine

## 2014-05-26 VITALS — BP 124/84 | HR 76 | Temp 98.4°F

## 2014-05-26 DIAGNOSIS — L409 Psoriasis, unspecified: Secondary | ICD-10-CM

## 2014-05-26 DIAGNOSIS — E039 Hypothyroidism, unspecified: Secondary | ICD-10-CM

## 2014-05-26 DIAGNOSIS — Z79899 Other long term (current) drug therapy: Secondary | ICD-10-CM

## 2014-05-26 DIAGNOSIS — Z23 Encounter for immunization: Secondary | ICD-10-CM

## 2014-05-26 DIAGNOSIS — L408 Other psoriasis: Secondary | ICD-10-CM

## 2014-05-26 DIAGNOSIS — J0100 Acute maxillary sinusitis, unspecified: Secondary | ICD-10-CM

## 2014-05-26 DIAGNOSIS — E785 Hyperlipidemia, unspecified: Secondary | ICD-10-CM

## 2014-05-26 LAB — CBC WITH DIFFERENTIAL/PLATELET
Basophils Absolute: 0.1 10*3/uL (ref 0.0–0.1)
Basophils Relative: 1 % (ref 0–1)
Eosinophils Absolute: 0.2 10*3/uL (ref 0.0–0.7)
Eosinophils Relative: 3 % (ref 0–5)
HCT: 40.7 % (ref 36.0–46.0)
Hemoglobin: 13.9 g/dL (ref 12.0–15.0)
Lymphocytes Relative: 24 % (ref 12–46)
Lymphs Abs: 1.8 10*3/uL (ref 0.7–4.0)
MCH: 28.5 pg (ref 26.0–34.0)
MCHC: 34.2 g/dL (ref 30.0–36.0)
MCV: 83.4 fL (ref 78.0–100.0)
Monocytes Absolute: 0.7 10*3/uL (ref 0.1–1.0)
Monocytes Relative: 9 % (ref 3–12)
Neutro Abs: 4.9 10*3/uL (ref 1.7–7.7)
Neutrophils Relative %: 63 % (ref 43–77)
Platelets: 316 10*3/uL (ref 150–400)
RBC: 4.88 MIL/uL (ref 3.87–5.11)
RDW: 14.4 % (ref 11.5–15.5)
WBC: 7.7 10*3/uL (ref 4.0–10.5)

## 2014-05-26 LAB — LIPID PANEL
Cholesterol: 202 mg/dL — ABNORMAL HIGH (ref 0–200)
HDL: 58 mg/dL (ref >39)
LDL Cholesterol: 125 mg/dL — ABNORMAL HIGH (ref 0–99)
Total CHOL/HDL Ratio: 3.5 Ratio
Triglycerides: 96 mg/dL (ref <150)
VLDL: 19 mg/dL (ref 0–40)

## 2014-05-26 LAB — COMPREHENSIVE METABOLIC PANEL
ALT: 16 U/L (ref 0–35)
AST: 21 U/L (ref 0–37)
Albumin: 4.2 g/dL (ref 3.5–5.2)
Alkaline Phosphatase: 93 U/L (ref 39–117)
BUN: 22 mg/dL (ref 6–23)
CO2: 29 mEq/L (ref 19–32)
Calcium: 9.1 mg/dL (ref 8.4–10.5)
Chloride: 104 mEq/L (ref 96–112)
Creat: 0.72 mg/dL (ref 0.50–1.10)
Glucose, Bld: 76 mg/dL (ref 70–99)
Potassium: 4.8 mEq/L (ref 3.5–5.3)
Sodium: 140 mEq/L (ref 135–145)
Total Bilirubin: 0.6 mg/dL (ref 0.2–1.2)
Total Protein: 6.8 g/dL (ref 6.0–8.3)

## 2014-05-26 LAB — TSH: TSH: 1.45 u[IU]/mL (ref 0.350–4.500)

## 2014-05-26 MED ORDER — AMOXICILLIN 875 MG PO TABS: 875.0000 mg | ORAL_TABLET | Freq: Two times a day (BID) | ORAL | Status: DC

## 2014-05-26 NOTE — Patient Instructions (Signed)
Take all the antibiotic and if not wholly back to normal when you're done give me a call

## 2014-05-26 NOTE — Progress Notes (Signed)
   Subjective:    Patient ID: Kathy LazierIrma A Freeman, female    DOB: Aug 05, 1941, 73 y.o.   MRN: [id number]  HPI She has a several week history of difficulty with cough, nasal congestion, upper tooth discomfort, purulent postnasal drainage but no sneezing, itchy watery eyes, fever, chills. She does not smoke. 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 She remains physically active. She continues on medications listed in the chart. Review of record indicates no mammogram and patient is not interested in one. Immunizations were reviewed. Continues on her thyroid medication. She is having no difficulty with psoriasis and only minor arthritic complaints. Her weight is stable.   Review of Systems     Objective:   Physical Exam alert and in no distress. Tympanic membranes and canals are normal. Throat is clear. Tonsils are normal. Neck is supple without adenopathy or thyromegaly. Cardiac exam shows a regular sinus rhythm without murmurs or gallops. Lungs are clear to auscultation. Nasal mucosa is slightly red with tenderness over maxillary sinuses.        Assessment & Plan:  Hypothyroidism, unspecified hypothyroidism type - Plan: TSH  Psoriasis - Plan: CBC with Differential, Comprehensive metabolic panel  Hyperlipidemia LDL goal <100 - Plan: Lipid panel  Acute maxillary sinusitis, recurrence not specified - Plan: amoxicillin (AMOXIL) 875 MG tablet  Need for prophylactic vaccination against Streptococcus pneumoniae (pneumococcus) - Plan: Pneumococcal conjugate vaccine 13-valent  Encounter for long-term (current) use of other medications - Plan: CBC with Differential, Comprehensive metabolic panel  she is to call for an entirely better when she finishes the antibiotic.

## 2014-06-08 ENCOUNTER — Telehealth: Payer: Self-pay | Admitting: Family Medicine

## 2014-06-08 MED ORDER — FLUCONAZOLE 150 MG PO TABS: 150.0000 mg | ORAL_TABLET | Freq: Once | ORAL | Status: DC

## 2014-06-08 NOTE — Telephone Encounter (Signed)
Patient developed a yeast infection due to the antibiotic. I will give her Diflucan.

## 2014-07-12 ENCOUNTER — Other Ambulatory Visit: Payer: Self-pay | Admitting: Family Medicine

## 2014-09-08 ENCOUNTER — Ambulatory Visit (INDEPENDENT_AMBULATORY_CARE_PROVIDER_SITE_OTHER): Payer: Medicare Other | Admitting: Family Medicine

## 2014-09-08 ENCOUNTER — Encounter: Payer: Self-pay | Admitting: Family Medicine

## 2014-09-08 VITALS — BP 120/70 | HR 63 | Wt 220.0 lb

## 2014-09-08 DIAGNOSIS — H6692 Otitis media, unspecified, left ear: Secondary | ICD-10-CM

## 2014-09-08 MED ORDER — FLUCONAZOLE 150 MG PO TABS: 150.0000 mg | ORAL_TABLET | Freq: Once | ORAL | Status: DC

## 2014-09-08 MED ORDER — AMOXICILLIN-POT CLAVULANATE 875-125 MG PO TABS: 1.0000 | ORAL_TABLET | Freq: Two times a day (BID) | ORAL | Status: DC

## 2014-09-08 NOTE — Progress Notes (Signed)
   Subjective:    Patient ID: Kathy Freeman, female    DOB: 11/14/1940, 73 y.o.   MRN: [id number]  HPI he was seen in July and treated for bronchitis and subsequently developed yeast vaginitis. She was given Diflucan. She did not take all of the antibiotic and started taking it again due to reoccurrence of her symptoms 1 month ago. She complained of fatigue, coughing and itching. She did take approximately 1 week's worth of antibiotic. She still has a cough as well as congestion and she also complains of difficulty hearing from the left ear.  Review of Systems     Objective:   Physical Exam alert and in no distress. Tympanic membrane on the right is normal, left shows poor landmarks and is slightly erythematous, both canals are normal. Throat is clear. Tonsils are normal. Neck is supple without adenopathy or thyromegaly. Cardiac exam shows a regular sinus rhythm without murmurs or gallops. Lungs are clear to auscultation.        Assessment & Plan:  Acute left otitis media, recurrence not specified, unspecified otitis media type - Plan: amoxicillin-clavulanate (AUGMENTIN) 875-125 MG per tablet  she is to return here in 2 weeks for reevaluation of her left TM. Encouraged her to take all the medication and use Diflucan if needed.

## 2014-10-01 ENCOUNTER — Ambulatory Visit: Payer: Medicare Other | Admitting: Family Medicine

## 2014-10-05 ENCOUNTER — Encounter: Payer: Self-pay | Admitting: Family Medicine

## 2014-10-05 ENCOUNTER — Ambulatory Visit (INDEPENDENT_AMBULATORY_CARE_PROVIDER_SITE_OTHER): Payer: Medicare Other | Admitting: Family Medicine

## 2014-10-05 VITALS — BP 140/90 | HR 60 | Wt 219.0 lb

## 2014-10-05 DIAGNOSIS — L299 Pruritus, unspecified: Secondary | ICD-10-CM

## 2014-10-05 NOTE — Patient Instructions (Signed)
Use Allegra or Claritin during the day and Benadryl at night. Try to avoid long, hot, soapy showers because they dry your skin

## 2014-10-05 NOTE — Progress Notes (Signed)
   Subjective:    Patient ID: Kathy LazierIrma A Freeman, female    DOB: 07/28/1941, 73 y.o.   MRN: [id number]  HPI She is here for a recheck. Since last being seen she did get follow-up with Dr. Ezzard StandingNewman concerning her left ear. She has had previous surgery on that ear and presently does use a hearing aid. She also has noted difficulty with itching since she finished the antibiotic. She has not seen any rash. She cannot state anything makes it better or worse.   Review of Systems     Objective:   Physical Exam Alert and in no distress. Left ear canal is normal however the TM does show chronic changes. Exam of her skin shows no lesions.       Assessment & Plan:  Pruritus  Recommend using Claritin or Allegra during the day and Benadryl at night to help with the itching. At this point there is no cause for her pruritus.

## 2015-02-07 ENCOUNTER — Other Ambulatory Visit: Payer: Self-pay | Admitting: Family Medicine

## 2015-03-18 ENCOUNTER — Encounter: Payer: Self-pay | Admitting: Family Medicine

## 2015-03-18 ENCOUNTER — Ambulatory Visit (INDEPENDENT_AMBULATORY_CARE_PROVIDER_SITE_OTHER): Payer: Medicare Other | Admitting: Family Medicine

## 2015-03-18 VITALS — BP 170/90 | HR 74

## 2015-03-18 DIAGNOSIS — R030 Elevated blood-pressure reading, without diagnosis of hypertension: Secondary | ICD-10-CM | POA: Diagnosis not present

## 2015-03-18 DIAGNOSIS — J301 Allergic rhinitis due to pollen: Secondary | ICD-10-CM | POA: Diagnosis not present

## 2015-03-18 DIAGNOSIS — IMO0001 Reserved for inherently not codable concepts without codable children: Secondary | ICD-10-CM

## 2015-03-18 MED ORDER — ALBUTEROL SULFATE 108 (90 BASE) MCG/ACT IN AEPB: 2.0000 | INHALATION_SPRAY | Freq: Four times a day (QID) | RESPIRATORY_TRACT | Status: DC | PRN

## 2015-03-18 NOTE — Progress Notes (Signed)
   Subjective:    Patient ID: Kathy LazierIrma A Freeman, female    DOB: 05/25/1941, 74 y.o.   MRN: [id number]  HPI She complains of a one-month history of itchy watery eyes, sneezing, dry cough with occasional wheezing. No rhinorrhea, sore throat, earache. She does not smoke and she is had no fever or chills. She has been on Zyrtec.   Review of Systems     Objective:   Physical Exam Alert and in no distress. Tympanic membrane on the left does show a perforation which is old, right TM and canal normal.. Pharyngeal area is normal. Neck is supple without adenopathy or thyromegaly. Cardiac exam shows a regular sinus rhythm without murmurs or gallops. Lungs are clear to auscultation.        Assessment & Plan:  Allergic rhinitis due to pollen - Plan: Albuterol Sulfate (PROAIR RESPICLICK) 108 (90 BASE) MCG/ACT AEPB  Elevated blood pressure Recommend switching to Claritin or Allegra as well as using Flonase. She is to use the pro-air as needed. She will follow-up with her ENT concerning the left ear as per previous protocol. If no improvement in her symptoms, she is to return here.

## 2015-03-18 NOTE — Patient Instructions (Addendum)
Switch to Claritin or Allegra. Add Flonase to this as well. We'll call in an inhaler for the wheezing If this doesn't work come back

## 2015-06-06 ENCOUNTER — Other Ambulatory Visit: Payer: Self-pay | Admitting: Family Medicine

## 2015-06-23 ENCOUNTER — Encounter: Payer: Self-pay | Admitting: Family Medicine

## 2015-06-23 ENCOUNTER — Ambulatory Visit (INDEPENDENT_AMBULATORY_CARE_PROVIDER_SITE_OTHER): Payer: Medicare Other | Admitting: Family Medicine

## 2015-06-23 VITALS — BP 126/82 | HR 66 | Ht 63.5 in | Wt 225.0 lb

## 2015-06-23 DIAGNOSIS — M1990 Unspecified osteoarthritis, unspecified site: Secondary | ICD-10-CM | POA: Diagnosis not present

## 2015-06-23 DIAGNOSIS — Z0000 Encounter for general adult medical examination without abnormal findings: Secondary | ICD-10-CM | POA: Diagnosis not present

## 2015-06-23 DIAGNOSIS — L409 Psoriasis, unspecified: Secondary | ICD-10-CM | POA: Diagnosis not present

## 2015-06-23 DIAGNOSIS — I7300 Raynaud's syndrome without gangrene: Secondary | ICD-10-CM | POA: Diagnosis not present

## 2015-06-23 DIAGNOSIS — L408 Other psoriasis: Secondary | ICD-10-CM | POA: Diagnosis not present

## 2015-06-23 DIAGNOSIS — J301 Allergic rhinitis due to pollen: Secondary | ICD-10-CM | POA: Diagnosis not present

## 2015-06-23 DIAGNOSIS — E038 Other specified hypothyroidism: Secondary | ICD-10-CM | POA: Diagnosis not present

## 2015-06-23 DIAGNOSIS — E785 Hyperlipidemia, unspecified: Secondary | ICD-10-CM | POA: Diagnosis not present

## 2015-06-23 DIAGNOSIS — M129 Arthropathy, unspecified: Secondary | ICD-10-CM | POA: Diagnosis not present

## 2015-06-23 LAB — CBC WITH DIFFERENTIAL/PLATELET
Basophils Absolute: 0 10*3/uL (ref 0.0–0.1)
Basophils Relative: 0 % (ref 0–1)
Eosinophils Absolute: 0.2 10*3/uL (ref 0.0–0.7)
Eosinophils Relative: 3 % (ref 0–5)
HCT: 40.6 % (ref 36.0–46.0)
Hemoglobin: 13.6 g/dL (ref 12.0–15.0)
Lymphocytes Relative: 19 % (ref 12–46)
Lymphs Abs: 1.3 10*3/uL (ref 0.7–4.0)
MCH: 28.3 pg (ref 26.0–34.0)
MCHC: 33.5 g/dL (ref 30.0–36.0)
MCV: 84.6 fL (ref 78.0–100.0)
MPV: 9.4 fL (ref 8.6–12.4)
Monocytes Absolute: 0.5 10*3/uL (ref 0.1–1.0)
Monocytes Relative: 7 % (ref 3–12)
Neutro Abs: 4.9 10*3/uL (ref 1.7–7.7)
Neutrophils Relative %: 71 % (ref 43–77)
Platelets: 306 10*3/uL (ref 150–400)
RBC: 4.8 MIL/uL (ref 3.87–5.11)
RDW: 14.4 % (ref 11.5–15.5)
WBC: 6.9 10*3/uL (ref 4.0–10.5)

## 2015-06-23 LAB — LIPID PANEL
Cholesterol: 198 mg/dL (ref 125–200)
HDL: 56 mg/dL (ref >=46)
LDL Cholesterol: 125 mg/dL (ref <130)
Total CHOL/HDL Ratio: 3.5 Ratio (ref <=5.0)
Triglycerides: 83 mg/dL (ref <150)
VLDL: 17 mg/dL (ref <30)

## 2015-06-23 LAB — COMPREHENSIVE METABOLIC PANEL
ALT: 16 U/L (ref 6–29)
AST: 19 U/L (ref 10–35)
Albumin: 4 g/dL (ref 3.6–5.1)
Alkaline Phosphatase: 95 U/L (ref 33–130)
BUN: 19 mg/dL (ref 7–25)
CO2: 28 mmol/L (ref 20–31)
Calcium: 9.2 mg/dL (ref 8.6–10.4)
Chloride: 105 mmol/L (ref 98–110)
Creat: 0.75 mg/dL (ref 0.60–0.93)
Glucose, Bld: 88 mg/dL (ref 65–99)
Potassium: 5 mmol/L (ref 3.5–5.3)
Sodium: 141 mmol/L (ref 135–146)
Total Bilirubin: 0.5 mg/dL (ref 0.2–1.2)
Total Protein: 6.9 g/dL (ref 6.1–8.1)

## 2015-06-23 LAB — TSH: TSH: 2.413 u[IU]/mL (ref 0.350–4.500)

## 2015-06-23 MED ORDER — LEVOTHYROXINE SODIUM 100 MCG PO TABS: ORAL_TABLET | ORAL | Status: DC

## 2015-06-23 NOTE — Progress Notes (Signed)
   Subjective:    Patient ID: Kathy Freeman, female    DOB: 12/25/1940, 74 y.o.   MRN: [id number]  HPI She is here for complete examination.She has no particular concerns or complaints. She does have arthritis and usually uses Advil with good results. She also is taking Claritin for her underlying allergies. She does not complain of sneezing, itchy watery eyes, rhinorrhea.She does have psoriasis mainly on her ankles and does use a cortisone cream. This keeps it under fairly good control. She does have a previous history of hyperlipidemia but presently is on no medication. She is on Synthroid for treatment of her hypothyroid. She's had no weight change, skin or hair changes. Has a remote history of Raynaud disease but none in the last several years.Smoking and drinking were reviewed. Family and social history as well as health maintenance and immunizations were reviewed. She is not interesting in having a mammogram or DEXA scan. Kathy Freeman directive was discussed with her.She does have one in place and is not interested in any measures.   Review of Systems  All other systems reviewed and are negative.      Objective:   Physical Exam BP 126/82 mmHg  Pulse 66  Ht 5' 3.5" (1.613 m)  Wt 225 lb (102.059 kg)  BMI 39.23 kg/m2  SpO2 97%  General Appearance:    Alert, cooperative, no distress, appears stated age  Head:    Normocephalic, without obvious abnormality, atraumatic  Eyes:    PERRL, conjunctiva/corneas clear, EOM's intact, fundi    benign  Ears:    Normal TM's and external ear canals  Nose:   Nares normal, mucosa normal, no drainage or sinus   tenderness  Throat:   Lips, mucosa, and tongue normal; teeth and gums normal  Neck:   Supple, no lymphadenopathy;  thyroid:  no   enlargement/tenderness/nodules; no carotid   bruit or JVD  Back:    Spine nontender, no curvature, ROM normal, no CVA     tenderness  Lungs:     Clear to auscultation bilaterally without wheezes, rales or     ronchi;  respirations unlabored  Chest Wall:    No tenderness or deformity   Heart:    Regular rate and rhythm, S1 and S2 normal, no murmur, rub   or gallop  Breast Exam:    Deferred to GYN  Abdomen:     Soft, non-tender, nondistended, normoactive bowel sounds,    no masses, no hepatosplenomegaly  Genitalia:    Deferred to GYN     Extremities:   No clubbing, cyanosis or edema  Pulses:   2+ and symmetric all extremities  Skin:   Skin color, texture, turgor normal, no rashes or lesions  Lymph nodes:   Cervical, supraclavicular, and axillary nodes normal  Neurologic:   CNII-XII intact, normal strength, sensation and gait; reflexes 2+ and symmetric throughout          Psych:   Normal mood, affect, hygiene and grooming.          Assessment & Plan:  Other specified hypothyroidism - Plan: TSH  Allergic rhinitis due to pollen  Psoriasis - Plan: CBC with Differential/Platelet, Comprehensive metabolic panel  Raynaud's disease  Arthritis - Plan: CBC with Differential/Platelet, Comprehensive metabolic panel  Hyperlipidemia LDL goal <130 - Plan: CBC with Differential/Platelet, Comprehensive metabolic panel, Lipid panel I encouraged her to continue to take good care of herself.

## 2015-06-28 DIAGNOSIS — M722 Plantar fascial fibromatosis: Secondary | ICD-10-CM | POA: Diagnosis not present

## 2015-06-30 DIAGNOSIS — H1850 Unspecified hereditary corneal dystrophies: Secondary | ICD-10-CM | POA: Diagnosis not present

## 2016-03-23 ENCOUNTER — Encounter: Payer: Self-pay | Admitting: Family Medicine

## 2016-03-23 ENCOUNTER — Ambulatory Visit (INDEPENDENT_AMBULATORY_CARE_PROVIDER_SITE_OTHER): Payer: Medicare Other | Admitting: Family Medicine

## 2016-03-23 VITALS — BP 130/90 | HR 77 | Temp 98.1°F | Wt 239.0 lb

## 2016-03-23 DIAGNOSIS — J301 Allergic rhinitis due to pollen: Secondary | ICD-10-CM

## 2016-03-23 DIAGNOSIS — J0100 Acute maxillary sinusitis, unspecified: Secondary | ICD-10-CM | POA: Diagnosis not present

## 2016-03-23 MED ORDER — AMOXICILLIN-POT CLAVULANATE 875-125 MG PO TABS: 1.0000 | ORAL_TABLET | Freq: Two times a day (BID) | ORAL | Status: DC

## 2016-03-23 NOTE — Patient Instructions (Signed)
To Allegra and use  Nasacort. Take all the antibiotic and let me know how you're doing when you finish

## 2016-03-23 NOTE — Progress Notes (Signed)
   Subjective:    Patient ID: Kathy Freeman, female    DOB: 13-Jan-1941, 75 y.o.   MRN: [id number]  HPI  she complains of a several month history of continued difficulty with itchy watery eyes, rhinorrhea, headache, sneezing. She does have an underlying history of allergies and has been using Claritin. She also has noted occasional wheezing with some dry cough and PND as well as upper tooth discomfort. She has seen her dentist and has upper dentures that she is trying to get used to. She does not smoke.   Review of Systems     Objective:   Physical Exam Alert and in no distress. Tympanic membranes and canals are normal. Pharyngeal area is normal. Neck is supple without adenopathy or thyromegaly. Cardiac exam shows a regular sinus rhythm without murmurs or gallops. Lungs are clear to auscultation. Nasal mucosa is normal. Tender to palpation over maxillary sinuses.        Assessment & Plan:  Non-seasonal allergic rhinitis due to pollen  Acute maxillary sinusitis, recurrence not specified - Plan: amoxicillin-clavulanate (AUGMENTIN) 875-125 MG tablet  recommend she switch to Allegra and use Nasacort as she did have difficulty with Flonase. She is to call me when she finishes the antibiotic.

## 2016-06-28 DIAGNOSIS — H1711 Central corneal opacity, right eye: Secondary | ICD-10-CM | POA: Diagnosis not present

## 2016-07-13 ENCOUNTER — Other Ambulatory Visit: Payer: Self-pay | Admitting: Family Medicine

## 2016-07-13 DIAGNOSIS — E038 Other specified hypothyroidism: Secondary | ICD-10-CM

## 2016-07-19 ENCOUNTER — Ambulatory Visit (INDEPENDENT_AMBULATORY_CARE_PROVIDER_SITE_OTHER): Payer: Medicare Other | Admitting: Family Medicine

## 2016-07-19 ENCOUNTER — Encounter: Payer: Self-pay | Admitting: Family Medicine

## 2016-07-19 VITALS — BP 126/80 | HR 73 | Wt 238.0 lb

## 2016-07-19 DIAGNOSIS — M5489 Other dorsalgia: Secondary | ICD-10-CM

## 2016-07-19 DIAGNOSIS — R8290 Unspecified abnormal findings in urine: Secondary | ICD-10-CM

## 2016-07-19 LAB — POCT URINALYSIS DIPSTICK
Bilirubin, UA: NEGATIVE
Blood, UA: POSITIVE
Glucose, UA: NEGATIVE
Ketones, UA: NEGATIVE
Nitrite, UA: NEGATIVE
Protein, UA: NEGATIVE
Spec Grav, UA: >=1.03
Urobilinogen, UA: NEGATIVE
pH, UA: 6

## 2016-07-19 MED ORDER — CIPROFLOXACIN HCL 500 MG PO TABS: 500.0000 mg | ORAL_TABLET | Freq: Two times a day (BID) | ORAL | 0 refills | Status: DC

## 2016-07-19 NOTE — Patient Instructions (Signed)
For arthritis refers recommend using Tylenol, 2 tablets 4 times per day. Then use Advil up to 4 tablets 3 times per day and then use tramadol Which back here in 2 weeks so we can recheck your urine

## 2016-07-19 NOTE — Progress Notes (Signed)
   Subjective:    Patient ID: Kathy LazierIrma A Granada, female    DOB: 04/20/1941, 75 y.o.   MRN: [id number]  HPI She complains of a 2 day history of right buttock type pain. She does not complain of pain in the hip or back area with no radiation. There is a questionable radiation into her abdomen however. No dysuria, urgency, frequency.   Review of Systems     Objective:   Physical Exam Alert and in no distress. Abdominal exam shows no masses or tenderness. Back exam does show some pain with forward flexion and slight pain with rotation. Urine dipstick was positive and unspun urine was contaminated but did show some white cells and bacteria.       Assessment & Plan:  Abnormal urine finding - Plan: POCT Urinalysis Dipstick, ciprofloxacin (CIPRO) 500 MG tablet  Midline back pain, unspecified location Difficult to say exactly what's causing her symptoms but I will treat for both a UTI and nonspecific back pain. She will return here in 10 days to 2 weeks to recheck since she did show slight blood in her dipstick.

## 2016-07-27 DIAGNOSIS — M47816 Spondylosis without myelopathy or radiculopathy, lumbar region: Secondary | ICD-10-CM | POA: Diagnosis not present

## 2016-08-09 ENCOUNTER — Encounter: Payer: Self-pay | Admitting: Family Medicine

## 2016-08-09 ENCOUNTER — Ambulatory Visit (INDEPENDENT_AMBULATORY_CARE_PROVIDER_SITE_OTHER): Payer: Medicare Other | Admitting: Family Medicine

## 2016-08-09 VITALS — BP 130/80 | HR 68 | Resp 16 | Wt 238.2 lb

## 2016-08-09 DIAGNOSIS — N390 Urinary tract infection, site not specified: Secondary | ICD-10-CM | POA: Diagnosis not present

## 2016-08-09 DIAGNOSIS — R8290 Unspecified abnormal findings in urine: Secondary | ICD-10-CM

## 2016-08-09 DIAGNOSIS — Z23 Encounter for immunization: Secondary | ICD-10-CM

## 2016-08-09 LAB — POCT URINALYSIS DIPSTICK
Bilirubin, UA: NEGATIVE
Blood, UA: NEGATIVE
Glucose, UA: NEGATIVE
Ketones, UA: NEGATIVE
Nitrite, UA: NEGATIVE
Protein, UA: NEGATIVE
Spec Grav, UA: 1.02
Urobilinogen, UA: NEGATIVE
pH, UA: 6

## 2016-08-09 NOTE — Progress Notes (Signed)
   Subjective:    Patient ID: Kathy LazierIrma A Freeman, female    DOB: 05/31/1941, 75 y.o.   MRN: [id number]  HPI She is here for a recheck. She states that she is now having no urinary symptoms and in fact her back pain is almost entirely gone. She does feel much better.   Review of Systems     Objective:   Physical Exam Alert and in no distress. Urine dipstick was essentially negative.       Assessment & Plan:  Urinary tract infection without hematuria, site unspecified - Plan: Urinalysis Dipstick  Need for prophylactic vaccination and inoculation against influenza - Plan: Flu vaccine HIGH DOSE PF (Fluzone High dose)  Abnormal urine finding  Flu shot will be given. She will return for routine care.

## 2016-09-04 DIAGNOSIS — Z79891 Long term (current) use of opiate analgesic: Secondary | ICD-10-CM | POA: Diagnosis not present

## 2016-09-04 DIAGNOSIS — M47816 Spondylosis without myelopathy or radiculopathy, lumbar region: Secondary | ICD-10-CM | POA: Diagnosis not present

## 2016-10-23 ENCOUNTER — Other Ambulatory Visit: Payer: Self-pay | Admitting: Family Medicine

## 2016-10-23 DIAGNOSIS — E038 Other specified hypothyroidism: Secondary | ICD-10-CM

## 2016-12-04 DIAGNOSIS — Z79891 Long term (current) use of opiate analgesic: Secondary | ICD-10-CM | POA: Diagnosis not present

## 2016-12-04 DIAGNOSIS — M47816 Spondylosis without myelopathy or radiculopathy, lumbar region: Secondary | ICD-10-CM | POA: Diagnosis not present

## 2016-12-15 ENCOUNTER — Other Ambulatory Visit: Payer: Self-pay | Admitting: Nurse Practitioner

## 2016-12-15 ENCOUNTER — Encounter (HOSPITAL_COMMUNITY): Payer: Self-pay

## 2016-12-15 ENCOUNTER — Emergency Department (HOSPITAL_COMMUNITY): Payer: Worker's Compensation

## 2016-12-15 ENCOUNTER — Emergency Department (HOSPITAL_COMMUNITY)
Admission: EM | Admit: 2016-12-15 | Discharge: 2016-12-15 | Disposition: A | Discharge status: Home / Self Care | Payer: Worker's Compensation | Attending: Emergency Medicine | Admitting: Emergency Medicine

## 2016-12-15 ENCOUNTER — Ambulatory Visit
Admission: RE | Admit: 2016-12-15 | Discharge: 2016-12-15 | Disposition: A | Discharge status: Home / Self Care | Payer: Worker's Compensation | Source: Ambulatory Visit | Attending: Nurse Practitioner | Admitting: Nurse Practitioner

## 2016-12-15 DIAGNOSIS — Y990 Civilian activity done for income or pay: Secondary | ICD-10-CM | POA: Insufficient documentation

## 2016-12-15 DIAGNOSIS — Y929 Unspecified place or not applicable: Secondary | ICD-10-CM | POA: Insufficient documentation

## 2016-12-15 DIAGNOSIS — W19XXXA Unspecified fall, initial encounter: Secondary | ICD-10-CM

## 2016-12-15 DIAGNOSIS — S8992XA Unspecified injury of left lower leg, initial encounter: Secondary | ICD-10-CM | POA: Diagnosis present

## 2016-12-15 DIAGNOSIS — S82092A Other fracture of left patella, initial encounter for closed fracture: Secondary | ICD-10-CM | POA: Diagnosis not present

## 2016-12-15 DIAGNOSIS — W010XXA Fall on same level from slipping, tripping and stumbling without subsequent striking against object, initial encounter: Secondary | ICD-10-CM | POA: Diagnosis not present

## 2016-12-15 DIAGNOSIS — S82002A Unspecified fracture of left patella, initial encounter for closed fracture: Secondary | ICD-10-CM

## 2016-12-15 DIAGNOSIS — E039 Hypothyroidism, unspecified: Secondary | ICD-10-CM | POA: Insufficient documentation

## 2016-12-15 DIAGNOSIS — Y9301 Activity, walking, marching and hiking: Secondary | ICD-10-CM | POA: Insufficient documentation

## 2016-12-15 DIAGNOSIS — Z79899 Other long term (current) drug therapy: Secondary | ICD-10-CM | POA: Insufficient documentation

## 2016-12-15 MED ORDER — HYDROCODONE-ACETAMINOPHEN 5-325 MG PO TABS: 2.0000 | ORAL_TABLET | ORAL | 0 refills | Status: DC | PRN

## 2016-12-15 NOTE — ED Notes (Signed)
Ortho Tech en route  

## 2016-12-15 NOTE — ED Notes (Signed)
Pt walked without difficulty.   "Much Better"

## 2016-12-15 NOTE — ED Triage Notes (Signed)
Pt was walking at work today and tripped on a water hose. She fell on to her L knee. Denies LOC, hitting head, or any other complaints. Pt is ambulatory. A&Ox4.

## 2016-12-15 NOTE — ED Provider Notes (Signed)
[facility]-EMERGENCY DEPT Provider Note   CSN: [id number] Arrival date & time: 12/15/16  1618   By signing my name below, I, Teofilo Pod, attest that this documentation has been prepared under the direction and in the presence of Azucena Kuba, PA-C. Electronically Signed: Teofilo Pod, ED Scribe. 12/15/2016. 5:25 PM.   History   Chief Complaint Chief Complaint  Patient presents with  . Knee Pain    The history is provided by the patient. No language interpreter was used.   HPI Comments:  Kathy Freeman is a 76 y.o. female who presents to the Emergency Department s/p left knee injury that occurred this morning. Pt reports that she was walking at work and tripped over a hose and fell on to her left knee. She complains of gradually worsening pain to her left knee. Pt denies LOC, hitting her head, or any other complaints. She states that 2 other employees helped her up. Pt is ambulatory but with pain. No alleviating factors noted. Pt denies other associated symptoms.   Past Medical History:  Diagnosis Date  . Allergy    RHINITIS  . Arthritis   . Colonic polyp   . Hemorrhoids   . Obesity   . Psoriasis   . Raynaud disease   . Thyroid disease    HYPOTHYROID    Patient Active Problem List   Diagnosis Date Noted  . Hyperlipidemia LDL goal <130 06/23/2015  . Arthritis 09/13/2011  . Hypothyroidism 05/25/2011  . Allergic rhinitis due to pollen 05/25/2011  . Psoriasis 05/25/2011  . Raynaud's disease 05/25/2011    Past Surgical History:  Procedure Laterality Date  . ABDOMINAL HYSTERECTOMY  1985  . CHOLECYSTECTOMY    . Left ear      OB History    No data available       Home Medications    Prior to Admission medications   Medication Sig Start Date End Date Taking? Authorizing Provider  ciprofloxacin (CIPRO) 500 MG tablet Take 1 tablet (500 mg total) by mouth 2 (two) times daily. Patient not taking: Reported on 08/09/2016 07/19/16   Ronnald Nian, MD  levothyroxine  (SYNTHROID, LEVOTHROID) 100 MCG tablet take 1 tablet by mouth once daily before BREAKFAST 10/23/16   Ronnald Nian, MD  Multiple Vitamins-Minerals (MULTIVITAMIN WITH MINERALS) tablet Take 1 tablet by mouth daily.      Historical Provider, MD  traMADol (ULTRAM-ER) 200 MG 24 hr tablet Take 50 mg by mouth daily.     Historical Provider, MD    Family History Family History  Problem Relation Age of Onset  . Arthritis Mother   . Diabetes Mother   . Heart disease Mother   . Arthritis Father   . Cancer Father   . Diabetes Father   . Hypertension Father   . Diabetes Sister   . Heart disease Sister   . Hypertension Sister   . Diabetes Brother   . Mental illness Brother   . Hypertension Brother     Social History Social History  Substance Use Topics  . Smoking status: Never Smoker  . Smokeless tobacco: Never Used  . Alcohol use No     Allergies   Asa [aspirin]; Sulfa antibiotics; and Sterapred [prednisone]   Review of Systems Review of Systems  Musculoskeletal: Positive for arthralgias.  Neurological: Negative for syncope.     Physical Exam Updated Vital Signs BP 145/85 (BP Location: Right Arm)   Pulse 113   Temp 97.8 F (36.6 C) (Oral)  Resp 16   SpO2 93%   Physical Exam  Constitutional: She appears well-developed and well-nourished. No distress.  HENT:  Head: Normocephalic and atraumatic.  Eyes: Conjunctivae are normal.  Neck: Normal range of motion. Neck supple.  No midline C-spine tenderness. No deformities or step-offs noted.  Cardiovascular: Regular rhythm.   Mildly tachycardic on exam. Patient states she is in pain likely causing her tachycardia.  Pulmonary/Chest: Effort normal.  Abdominal: Soft. Bowel sounds are normal. She exhibits no distension. There is no rebound and no guarding.  Musculoskeletal:       Left knee: She exhibits decreased range of motion (Due to pain but is able to flex and extend knee), swelling (mild over the patella), ecchymosis  (minimall over the patella), abnormal patellar mobility, abnormal meniscus and MCL laxity. She exhibits no effusion (do not appreciate any effusion), no deformity, normal alignment and no LCL laxity. Tenderness found. Patellar tendon tenderness noted. No medial joint line and no lateral joint line tenderness noted.  Patient without any T-spine or L-spine midline tenderness. No deformities or step-offs noted. No paraspinal tenderness.  Patient is able to ambulate and bear weight on her left leg. No joint laxity was noted on my exam. No significant effusion was noted. Minimal swelling and ecchymosis was noted.  DP pulses are 2+ bilaterally. Sensation intact to sharp/dull. Full range of motion of her left ankle and left foot. Denies pain anywhere else besides her left knee. Cap refill normal.  Strength 5 out 5 in lower extremities.  Neurological: She is alert.  Skin: Skin is warm and dry.  Psychiatric: She has a normal mood and affect.  Nursing note and vitals reviewed.    ED Treatments / Results  DIAGNOSTIC STUDIES:  Oxygen Saturation is 95% on RA, normal by my interpretation.    COORDINATION OF CARE:  5:25 PM Discussed treatment plan with pt at bedside and pt agreed to plan.   Labs (all labs ordered are listed, but only abnormal results are displayed) Labs Reviewed - No data to display  EKG  EKG Interpretation None       Radiology Dg Knee Complete 4 Views Left  Result Date: 12/15/2016 CLINICAL DATA:  Fall. EXAM: LEFT KNEE - COMPLETE 4+ VIEW COMPARISON:  None. FINDINGS: Vertical nondisplaced fracture of the lateral patella. Moderate joint effusion Marked joint space narrowing in the medial compartment with associated spurring. Lateral joint space normal. IMPRESSION: Nondisplaced patellar fracture Marked medial joint space narrowing.  Knee joint effusion. Electronically Signed   By: Marlan Palau M.D.   On: 12/15/2016 15:50    Procedures Procedures (including critical care  time)  Medications Ordered in ED Medications - No data to display   Initial Impression / Assessment and Plan / ED Course  I have reviewed the triage vital signs and the nursing notes.  Pertinent labs & imaging results that were available during my care of the patient were reviewed by me and considered in my medical decision making (see chart for details).     Patient resents to the ED after sustaining a mechanical fall onto her left knee after tripping over a hose.. Patient had x-ray performed at [facility] imaging prior to coming to the ED. It was noted she had a nondisplaced patellar fracture with a small knee joint effusion. Patient is able to ambulate and put pressure on her left leg. She is neurovascularly intact. She is able to ambulate with mild pain. I do not appreciate a significant amount of effusion on my  exam. Patient is able to fully extend her knee and she does have partial flexion of the knee. Patient was placed into a knee immobilizer. She was able to ambulate and states that she felt much better. Patient given a small course of pain medicine. States she sees [facility]. I have encouraged her to follow-up with them on Monday. She states will call for an appointment. Patient mild tachycardic in the ED. Patient states she is in pain which is likely causing her tachycardia. She is normotensive. She denies any chest pain, shortness of breath, headache, vision changes. Encouraged rice therapy. With minimal weightbearing. Discussed patient with Dr. Verdie MosherLiu who is agreeable with above plan. Pt is hemodynamically stable, in NAD, & able to ambulate in the ED. Pain has been managed & has no complaints prior to dc. Pt is comfortable with above plan and is stable for discharge at this time. All questions were answered prior to disposition. Strict return precautions for f/u to the ED were discussed.   Final Clinical Impressions(s) / ED Diagnoses   Final diagnoses:  Closed  nondisplaced fracture of left patella, unspecified fracture morphology, initial encounter    New Prescriptions Discharge Medication List as of 12/15/2016  6:09 PM    START taking these medications   Details  HYDROcodone-acetaminophen (NORCO/VICODIN) 5-325 MG tablet Take 2 tablets by mouth every 4 (four) hours as needed., Starting Fri 12/15/2016, Print      I personally performed the services described in this documentation, which was scribed in my presence. The recorded information has been reviewed and is accurate.     Rise MuKenneth T Aariz Maish, PA-C 12/15/16 2004    Lavera Guiseana Duo Liu, MD 12/16/16 1256

## 2016-12-15 NOTE — ED Notes (Signed)
Worker's comp urine screen requested.

## 2016-12-15 NOTE — Discharge Instructions (Signed)
You have a fracture of your left patella. Please wear the immobilizer. Please rest, ice, elevate her left leg. You need to call your orthopedist on Monday to schedule an appointment as soon as possible. I have given her a short course of pain medicine. Please be cautious in taking this medication as it will make you drowsy. Return to the ED if you're unable to bear any weight on her left leg, worsening swelling, redness, worsening pain.

## 2017-02-08 ENCOUNTER — Other Ambulatory Visit: Payer: Self-pay | Admitting: Family Medicine

## 2017-02-08 DIAGNOSIS — E038 Other specified hypothyroidism: Secondary | ICD-10-CM

## 2017-03-29 DIAGNOSIS — M47816 Spondylosis without myelopathy or radiculopathy, lumbar region: Secondary | ICD-10-CM | POA: Diagnosis not present

## 2017-03-29 DIAGNOSIS — Z79891 Long term (current) use of opiate analgesic: Secondary | ICD-10-CM | POA: Diagnosis not present

## 2017-05-29 ENCOUNTER — Other Ambulatory Visit: Payer: Self-pay | Admitting: Family Medicine

## 2017-05-29 DIAGNOSIS — E038 Other specified hypothyroidism: Secondary | ICD-10-CM

## 2017-07-05 ENCOUNTER — Other Ambulatory Visit: Payer: Self-pay | Admitting: Family Medicine

## 2017-07-05 DIAGNOSIS — E038 Other specified hypothyroidism: Secondary | ICD-10-CM

## 2017-07-05 NOTE — Telephone Encounter (Signed)
Pt has an appt with Dr. Susann Givens 07/19/17 will refill for 30 days

## 2017-07-10 DIAGNOSIS — H1711 Central corneal opacity, right eye: Secondary | ICD-10-CM | POA: Diagnosis not present

## 2017-07-19 ENCOUNTER — Ambulatory Visit (INDEPENDENT_AMBULATORY_CARE_PROVIDER_SITE_OTHER): Payer: Medicare Other | Admitting: Family Medicine

## 2017-07-19 ENCOUNTER — Encounter: Payer: Self-pay | Admitting: Family Medicine

## 2017-07-19 DIAGNOSIS — E038 Other specified hypothyroidism: Secondary | ICD-10-CM

## 2017-07-19 DIAGNOSIS — Z23 Encounter for immunization: Secondary | ICD-10-CM

## 2017-07-19 DIAGNOSIS — M1990 Unspecified osteoarthritis, unspecified site: Secondary | ICD-10-CM

## 2017-07-19 DIAGNOSIS — J301 Allergic rhinitis due to pollen: Secondary | ICD-10-CM

## 2017-07-19 DIAGNOSIS — I7300 Raynaud's syndrome without gangrene: Secondary | ICD-10-CM

## 2017-07-19 DIAGNOSIS — L409 Psoriasis, unspecified: Secondary | ICD-10-CM | POA: Diagnosis not present

## 2017-07-19 DIAGNOSIS — Z0000 Encounter for general adult medical examination without abnormal findings: Secondary | ICD-10-CM

## 2017-07-19 DIAGNOSIS — E785 Hyperlipidemia, unspecified: Secondary | ICD-10-CM | POA: Diagnosis not present

## 2017-07-19 LAB — POCT URINALYSIS DIP (PROADVANTAGE DEVICE)
Bilirubin, UA: NEGATIVE
Blood, UA: NEGATIVE
Glucose, UA: NEGATIVE mg/dL
Ketones, POC UA: NEGATIVE mg/dL
Nitrite, UA: NEGATIVE
Protein Ur, POC: NEGATIVE mg/dL
Specific Gravity, Urine: 1.015
Urobilinogen, Ur: NEGATIVE
pH, UA: 6 (ref 5.0–8.0)

## 2017-07-19 NOTE — Progress Notes (Signed)
Kathy Freeman is a 76 y.o. female who presents for annual wellness visit and follow-up on chronic medical conditions.  She has the following concerns: She does have underlying hypothyroidism and continues on Synthroid and having no difficulty with this. She has had recent left patellar fracture and still having some slight discomfort with this. She also has underlying arthritis and uses very low doses of Advil and occasionally tramadol for this. She has had previous left tympanic membrane surgery. She has a remote history of difficulty with Raynaud's disease but has not had any recent trouble with that. Her psoriasis does cause her some difficulty but she will only occasionally use cortisone cream on it. Her allergies seem to be under good control. She continues to work at the [employer]. Her marriage is going well.  Immunizations and Health Maintenance Immunization History  Administered Date(s) Administered  . Influenza, High Dose Seasonal PF 08/09/2016  . Influenza-Unspecified 08/02/2014, 09/14/2015  . Pneumococcal Conjugate-13 05/26/2014  . Pneumococcal-Unspecified 11/13/2013  . Tdap 03/26/2012  . Zoster 04/14/2013   Health Maintenance Due  Topic Date Due  . DEXA SCAN  06/24/2006  . INFLUENZA VACCINE  06/13/2017    Last Pap smear:  N/a  Last mammogram: 2016 Last colonoscopy: 2010 Last DEXA: n/a Patient not interested in getting one. [facility]:  [address]  Ophtho: Dr. Doylene CanningGold this week Exercise: works at [employer]- 5-6 hrs/day 3 days/week, does a lot of walking and staircases.   Other doctors caring for patient include: no   Advanced directives:  Working on these documents.     Depression screen:  See questionnaire below.  Depression screen [facility] 2/9 07/19/2017 06/23/2015 05/26/2014 03/26/2012  Decreased Interest 0 0 0 0  Down, Depressed, Hopeless 0 0 0 0  PHQ - 2 Score 0 0 0 0    Fall Risk Screen: see questionnaire below. Fall Risk  07/19/2017 06/23/2015 05/26/2014  Falls in the  past year? Yes No No  Number falls in past yr: 1 - -  Injury with Fall? Yes - -  Risk for fall due to : Impaired balance/gait - -    ADL screen:  See questionnaire below Functional Status Survey: Is the patient deaf or have difficulty hearing?: Yes (ringing in left ear. ) Does the patient have difficulty seeing, even when wearing glasses/contacts?: No Does the patient have difficulty concentrating, remembering, or making decisions?: No Does the patient have difficulty walking or climbing stairs?: Yes Does the patient have difficulty dressing or bathing?: No Does the patient have difficulty doing errands alone such as visiting a doctor's office or shopping?: No   Review of Systems Negative except as above PHYSICAL EXAM:  General Appearance: Alert, cooperative, no distress, appears stated age Head: Normocephalic, without obvious abnormality, atraumatic Eyes: PERRL, conjunctiva/corneas clear, EOM's intact, fundi benign Ears: Right TM and canal normal ; left TM was difficult to see but definitely appeared abnormal  Nose: Nares normal, mucosa normal, no drainage or sinus tenderness Throat: Lips, mucosa, and tongue normal; teeth and gums normal Neck: Supple, no lymphadenopathy;  thyroid:  no enlargement/tenderness/nodules; no carotid bruit or JVD Lungs: Clear to auscultation bilaterally without wheezes, rales or ronchi; respirations unlabored Heart: Regular rate and rhythm, S1 and S2 normal, no murmur, rubor gallop Abdomen: Soft, non-tender, nondistended, normoactive bowel sounds,  no masses, no hepatosplenomegaly Extremities: No clubbing, cyanosis or edema Pulses: 2+ and symmetric all extremities Skin:  Skin color, texture, turgor normal, no rashes or lesions Lymph nodes: Cervical, supraclavicular, and axillary nodes  normal Neurologic:  CNII-XII intact, normal strength, sensation and gait; reflexes 2+ and symmetric throughout Psych: Normal mood, affect, hygiene and  grooming.  ASSESSMENT/PLAN: Routine general medical examination at a health care facility - Plan: CBC with Differential/Platelet, Comprehensive metabolic panel, Lipid panel  Medicare annual wellness visit, subsequent - Plan: POCT Urinalysis DIP (Proadvantage Device)  Need for influenza vaccination - Plan: Flu vaccine HIGH DOSE PF (Fluzone High dose)  Psoriasis  Other specified hypothyroidism - Plan: TSH  Hyperlipidemia LDL goal <130 - Plan: Lipid panel  Arthritis  Non-seasonal allergic rhinitis due to pollen  Raynaud's disease without gangrene - Plan: CBC with Differential/Platelet, Comprehensive metabolic panel You can take as many as 4 ibuprofen 3 times per day. My suggestion would be for you to slowly increase your pills and then you would be going with the minimum effective dose Use higher doses of the ibuprofen rather than tramadol if you can Use cortisone cream on your psoriasis regularly I will readjust her thyroid depending upon what your blood work shows. Follow-up with ENT as needed.   Discussed monthly self breast exams and yearly mammograms; at least 30 minutes of aerobic activity at least 5 days/week and weight-bearing exercise 2x/week; proper sunscreen use reviewed; healthy diet, including goals of calcium and vitamin D intake and alcohol recommendations (less than or equal to 1 drink/day) reviewed; regular seatbelt use; changing batteries in smoke detectors.  Immunization recommendations discussed.  Colonoscopy recommendations reviewed   Medicare Attestation I have personally reviewed: The patient's medical and social history Their use of alcohol, tobacco or illicit drugs Their current medications and supplements The patient's functional ability including ADLs,fall risks, home safety risks, cognitive, and hearing and visual impairment Diet and physical activities Evidence for depression or mood disorders  The patient's weight, height, and BMI have been recorded  in the chart.  I have made referrals, counseling, and provided education to the patient based on review of the above and I have provided the patient with a written personalized care plan for preventive services.     Carollee Herter, MD   07/19/2017

## 2017-07-19 NOTE — Patient Instructions (Addendum)
You can take as many as 4 ibuprofen 3 times per day. My suggestion would be for you to slowly increase your pills and then you would be going with the minimum effective dose Use higher doses of the ibuprofen rather than tramadol if you can Use cortisone cream on your psoriasis regularly

## 2017-07-20 LAB — LIPID PANEL
Cholesterol: 204 mg/dL — ABNORMAL HIGH (ref <200)
HDL: 54 mg/dL (ref >50)
LDL Cholesterol (Calc): 125 mg/dL (calc) — ABNORMAL HIGH
Non-HDL Cholesterol (Calc): 150 mg/dL (calc) — ABNORMAL HIGH (ref <130)
Total CHOL/HDL Ratio: 3.8 (calc) (ref <5.0)
Triglycerides: 134 mg/dL (ref <150)

## 2017-07-20 LAB — COMPREHENSIVE METABOLIC PANEL
AG Ratio: 1.7 (calc) (ref 1.0–2.5)
ALT: 14 U/L (ref 6–29)
AST: 19 U/L (ref 10–35)
Albumin: 4.4 g/dL (ref 3.6–5.1)
Alkaline phosphatase (APISO): 92 U/L (ref 33–130)
BUN: 20 mg/dL (ref 7–25)
CO2: 31 mmol/L (ref 20–32)
Calcium: 9.3 mg/dL (ref 8.6–10.4)
Chloride: 105 mmol/L (ref 98–110)
Creat: 0.86 mg/dL (ref 0.60–0.93)
Globulin: 2.6 g/dL (calc) (ref 1.9–3.7)
Glucose, Bld: 94 mg/dL (ref 65–99)
Potassium: 4.3 mmol/L (ref 3.5–5.3)
Sodium: 142 mmol/L (ref 135–146)
Total Bilirubin: 0.6 mg/dL (ref 0.2–1.2)
Total Protein: 7 g/dL (ref 6.1–8.1)

## 2017-07-20 LAB — CBC WITH DIFFERENTIAL/PLATELET
Basophils Absolute: 60 cells/uL (ref 0–200)
Basophils Relative: 0.7 %
Eosinophils Absolute: 224 cells/uL (ref 15–500)
Eosinophils Relative: 2.6 %
HCT: 41.8 % (ref 35.0–45.0)
Hemoglobin: 13.8 g/dL (ref 11.7–15.5)
Lymphs Abs: 1514 cells/uL (ref 850–3900)
MCH: 28.4 pg (ref 27.0–33.0)
MCHC: 33 g/dL (ref 32.0–36.0)
MCV: 86 fL (ref 80.0–100.0)
MPV: 10.3 fL (ref 7.5–12.5)
Monocytes Relative: 6.8 %
Neutro Abs: 6218 cells/uL (ref 1500–7800)
Neutrophils Relative %: 72.3 %
Platelets: 329 10*3/uL (ref 140–400)
RBC: 4.86 10*6/uL (ref 3.80–5.10)
RDW: 13 % (ref 11.0–15.0)
Total Lymphocyte: 17.6 %
WBC mixed population: 585 cells/uL (ref 200–950)
WBC: 8.6 10*3/uL (ref 3.8–10.8)

## 2017-07-20 LAB — TSH: TSH: 3.19 mIU/L (ref 0.40–4.50)

## 2017-07-20 MED ORDER — LEVOTHYROXINE SODIUM 100 MCG PO TABS: ORAL_TABLET | ORAL | 3 refills | Status: DC

## 2017-07-20 NOTE — Addendum Note (Signed)
Addended by: Ronnald NianLALONDE, Perl Folmar C on: 07/20/2017 08:22 AM   Modules accepted: Orders

## 2017-10-08 ENCOUNTER — Ambulatory Visit (INDEPENDENT_AMBULATORY_CARE_PROVIDER_SITE_OTHER): Payer: Medicare Other | Admitting: Family Medicine

## 2017-10-08 ENCOUNTER — Telehealth: Payer: Self-pay | Admitting: Family Medicine

## 2017-10-08 ENCOUNTER — Encounter: Payer: Self-pay | Admitting: Family Medicine

## 2017-10-08 VITALS — BP 160/80 | HR 78 | Resp 16 | Wt 232.0 lb

## 2017-10-08 DIAGNOSIS — J40 Bronchitis, not specified as acute or chronic: Secondary | ICD-10-CM | POA: Diagnosis not present

## 2017-10-08 MED ORDER — CLARITHROMYCIN 500 MG PO TABS: 500.0000 mg | ORAL_TABLET | Freq: Two times a day (BID) | ORAL | 0 refills | Status: DC

## 2017-10-08 NOTE — Telephone Encounter (Signed)
Pt aware. /RLB  

## 2017-10-08 NOTE — Telephone Encounter (Signed)
Explained to her that that number milligrams is variable depending upon what medicine reusing and that should not cause her any trouble at all except may be an upset stomach and if so, let me know

## 2017-10-08 NOTE — Patient Instructions (Signed)
Take the antibiotic and if there is any question when you  finish it call me

## 2017-10-08 NOTE — Telephone Encounter (Signed)
Patient called about antibiotic that you sent in She said it is 500mg  and she can't take 500mg  of anything and that if she took it twice a day that she would not be able to get off the couch  Advised her that it is an antiobiotic, not a pain medication and should not have that effect on her Patient requested that I send note back to you

## 2017-10-08 NOTE — Progress Notes (Signed)
   Subjective:    Patient ID: Kathy LazierIrma A Wiest, female    DOB: [birth year], 76 y.o.   MRN: [id number]  HPI She complains of a 6676-month history of swelling of the neck face mainly at night with some dizziness.  She also has rhinorrhea with itchy watery eyes.  She has had difficulty with a cough for the last month with some chills but no fever, sore throat, earache, PND.  She has been taking Claritin and Advil.  Has a previous history of left mastoidectomy several years ago.  She does not smoke.   Review of Systems     Objective:   Physical Exam Alert and in no distress. Tympanic membrane on the left has poor landmarks.,  Right is normal with canals are normal. Pharyngeal area is normal. Neck is supple without adenopathy or thyromegaly. Cardiac exam shows a regular sinus rhythm without murmurs or gallops. Lungs are clear to auscultation. Blood pressure is recorded       Assessment & Plan:  Bronchitis - Plan: clarithromycin (BIAXIN) 500 MG tablet  I explained that this was definitely not clear-cut but could be a low-grade infection.  I will treat her with Biaxin and she will call if no improvement.  Blood pressure is elevated but we will not treat And have her return for repeat blood pressure.

## 2017-12-14 ENCOUNTER — Encounter (HOSPITAL_COMMUNITY): Payer: Self-pay | Admitting: Emergency Medicine

## 2017-12-14 ENCOUNTER — Other Ambulatory Visit: Payer: Self-pay

## 2017-12-14 ENCOUNTER — Ambulatory Visit (HOSPITAL_COMMUNITY)
Admission: EM | Admit: 2017-12-14 | Discharge: 2017-12-14 | Disposition: A | Discharge status: Home / Self Care | Payer: Worker's Compensation | Attending: Internal Medicine | Admitting: Internal Medicine

## 2017-12-14 DIAGNOSIS — W010XXA Fall on same level from slipping, tripping and stumbling without subsequent striking against object, initial encounter: Secondary | ICD-10-CM | POA: Diagnosis not present

## 2017-12-14 DIAGNOSIS — M79605 Pain in left leg: Secondary | ICD-10-CM | POA: Diagnosis not present

## 2017-12-14 NOTE — Discharge Instructions (Signed)
We are not getting x-rays today since you have good range of motion and no specific pain to the joint.  Please continue your Advil for pain.  Also apply ice a few times a day.   Elevate leg when you are at home.  Stay off of it to allow it to rest.  You may return to work as her ear pain is improving.  I expect gradual improvement after the next 48-72 hours.  Please return if pain not improving in 1-2 week or symptoms are worsening.  You develop significant pain, redness and swelling.

## 2017-12-14 NOTE — ED Provider Notes (Signed)
[facility]    CSN: [id number] Arrival date & time: 12/14/17  1811     History   Chief Complaint Chief Complaint  Patient presents with  . Leg Pain    HPI Kathy Freeman is a 77 y.o. female   history of arthritis presenting today with left leg pain.  He [facility] and tripped over a throw rug.  She fell forward onto her leg and hands.  Denies any upper extremity pain the pain is mainly on the front of her left tibia.  She is able to bear weight although mildly painful.  States her pain is more of a soreness rather than a pain.  Denies hitting head or any loss of consciousness.  Denies any nausea vomiting, lightheadedness, dizziness.  States she has weak ankles.  HPI  Past Medical History:  Diagnosis Date  . Allergy    RHINITIS  . Arthritis   . Colonic polyp   . Hemorrhoids   . Obesity   . Psoriasis   . Raynaud disease   . Thyroid disease    HYPOTHYROID    Patient Active Problem List   Diagnosis Date Noted  . Hyperlipidemia LDL goal <130 06/23/2015  . Arthritis 09/13/2011  . Hypothyroidism 05/25/2011  . Allergic rhinitis due to pollen 05/25/2011  . Psoriasis 05/25/2011  . Raynaud's disease 05/25/2011    Past Surgical History:  Procedure Laterality Date  . ABDOMINAL HYSTERECTOMY  1985  . CHOLECYSTECTOMY    . Left ear      OB History    No data available       Home Medications    Prior to Admission medications   Medication Sig Start Date End Date Taking? Authorizing Provider  traMADol (ULTRAM-ER) 200 MG 24 hr tablet Take 25 mg by mouth daily.    Yes [provider]  clarithromycin (BIAXIN) 500 MG tablet Take 1 tablet (500 mg total) by mouth 2 (two) times daily. 10/08/17   Ronnald NianLalonde, John C, MD  levothyroxine (SYNTHROID, LEVOTHROID) 100 MCG tablet take 1 tablet by mouth every morning before breakfast 07/20/17   Ronnald NianLalonde, John C, MD  Multiple Vitamins-Minerals (MULTIVITAMIN WITH MINERALS) tablet Take 1 tablet  by mouth daily.      [provider]    Family History Family History  Problem Relation Age of Onset  . Arthritis Mother   . Diabetes Mother   . Heart disease Mother   . Arthritis Father   . Cancer Father   . Diabetes Father   . Hypertension Father   . Diabetes Sister   . Heart disease Sister   . Hypertension Sister   . Diabetes Brother   . Mental illness Brother   . Hypertension Brother     Social History Social History   Tobacco Use  . Smoking status: Never Smoker  . Smokeless tobacco: Never Used  Substance Use Topics  . Alcohol use: No  . Drug use: No     Allergies   Asa [aspirin]; Sulfa antibiotics; and Sterapred [prednisone]   Review of Systems Review of Systems  Constitutional: Negative for fatigue and fever.  Respiratory: Negative for shortness of breath.   Cardiovascular: Negative for chest pain.  Gastrointestinal: Negative for nausea and vomiting.  Musculoskeletal: Positive for myalgias. Negative for gait problem.  Skin: Negative for color change and wound.  Neurological: Negative for dizziness, syncope, speech difficulty, weakness, light-headedness and headaches.     Physical Exam Triage Vital Signs ED  Triage Vitals [12/14/17 1829]  Enc Vitals Group     BP (!) 145/74     Pulse Rate 100     Resp 18     Temp 98.4 F (36.9 C)     Temp src      SpO2 100 %     Weight      Height      Head Circumference      Peak Flow      Pain Score 5     Pain Loc      Pain Edu?      Excl. in GC?    No data found.  Updated Vital Signs BP (!) 145/74   Pulse 100   Temp 98.4 F (36.9 C)   Resp 18   SpO2 100%    Physical Exam  Constitutional: She is oriented to person, place, and time. She appears well-developed and well-nourished. No distress.  Obese  HENT:  Head: Normocephalic and atraumatic.  Eyes: Conjunctivae are normal.  Neck: Neck supple.  Cardiovascular: Normal rate and regular rhythm.  No murmur heard. Pulmonary/Chest: Effort  normal and breath sounds normal. No respiratory distress.  Abdominal: Soft. There is no tenderness.  Musculoskeletal: She exhibits no edema.  Left lower extremity: Wearing compression bandages to support the ankle and knee.  No obvious deformity, no increased swelling compared to right.  Lower extremities appear large and swollen at baseline.  Varicose veins on foot and ankle.  Left knee nontender to palpation, nontender to lateral aspect near the fibula insertion at knee.  Mild tenderness to lower anterior tibia, no crepitus or deformity appreciated.  Nontender to palpation of medial malleolus mild tenderness to palpation of lateral malleolus.  Patient with full range of motion at ankle without any pain.  Neurological: She is alert and oriented to person, place, and time. No cranial nerve deficit. Coordination normal.  Skin: Skin is warm and dry.  Psychiatric: She has a normal mood and affect.  Nursing note and vitals reviewed.    UC Treatments / Results  Labs (all labs ordered are listed, but only abnormal results are displayed) Labs Reviewed - No data to display  EKG  EKG Interpretation None       Radiology No results found.  Procedures Procedures (including critical care time)  Medications Ordered in UC Medications - No data to display   Initial Impression / Assessment and Plan / UC Course  I have reviewed the triage vital signs and the nursing notes.  Pertinent labs & imaging results that were available during my care of the patient were reviewed by me and considered in my medical decision making (see chart for details).     Patient with minimal tenderness to palpation, full range of motion, imaging deferred.  Pain seems less than what would be expected if there were a fracture.  Patient ambulating with mild pain.  Pain seems to be more related to contusions/muscle soreness versus fracture or osseous abnormality.  Advised to continue Advil for pain.  Icing and elevation.   Resting over the weekend as much as possible.  Expect pain for the first 2-3 days followed by gradual improvement.  Advised to come back if symptoms not improving as expected, or worsening with development of increased pain, swelling, redness. Discussed strict return precautions. Patient verbalized understanding and is agreeable with plan.   Final Clinical Impressions(s) / UC Diagnoses   Final diagnoses:  Pain of left lower extremity    ED Discharge Orders  None       Controlled Substance Prescriptions [place] Controlled Substance Registry consulted? Not Applicable   Lew Dawes, [place] 12/14/17 2057

## 2017-12-14 NOTE — ED Triage Notes (Signed)
Pt slipped on a rug and fell forward, landed on her L leg. Pt ambulatory with steady gait. Denies hitting head, denies LOC.

## 2017-12-31 DIAGNOSIS — M722 Plantar fascial fibromatosis: Secondary | ICD-10-CM | POA: Diagnosis not present

## 2018-01-28 DIAGNOSIS — G894 Chronic pain syndrome: Secondary | ICD-10-CM | POA: Diagnosis not present

## 2018-01-28 DIAGNOSIS — M5416 Radiculopathy, lumbar region: Secondary | ICD-10-CM | POA: Diagnosis not present

## 2018-05-07 ENCOUNTER — Ambulatory Visit (INDEPENDENT_AMBULATORY_CARE_PROVIDER_SITE_OTHER): Payer: Medicare Other | Admitting: Family Medicine

## 2018-05-07 ENCOUNTER — Encounter: Payer: Self-pay | Admitting: Family Medicine

## 2018-05-07 VITALS — BP 144/96 | HR 81 | Temp 98.1°F | Wt 230.0 lb

## 2018-05-07 DIAGNOSIS — N3000 Acute cystitis without hematuria: Secondary | ICD-10-CM

## 2018-05-07 LAB — POCT URINALYSIS DIP (PROADVANTAGE DEVICE)
Bilirubin, UA: NEGATIVE
Blood, UA: NEGATIVE
Glucose, UA: NEGATIVE mg/dL
Ketones, POC UA: NEGATIVE mg/dL
Leukocytes, UA: NEGATIVE
Nitrite, UA: NEGATIVE
Protein Ur, POC: NEGATIVE mg/dL
Specific Gravity, Urine: 1.02
Urobilinogen, Ur: 3.5
pH, UA: 6 (ref 5.0–8.0)

## 2018-05-07 MED ORDER — CIPROFLOXACIN HCL 500 MG PO TABS: 500.0000 mg | ORAL_TABLET | Freq: Two times a day (BID) | ORAL | 0 refills | Status: DC

## 2018-05-07 NOTE — Progress Notes (Signed)
   Subjective:    Patient ID: Kathy Freeman, female    DOB: 1941/09/26, 77 y.o.   MRN: [id number]  HPI She complains of a several day history of urgency, frequency but no fever, chills, dysuria.   Review of Systems     Objective:   Physical Exam Alert and in no distress. Urine microscopic was loaded with bacteria and occasional white cells.       Assessment & Plan:  Acute cystitis without hematuria - Plan: ciprofloxacin (CIPRO) 500 MG tablet  She will call if further difficulty.

## 2018-05-07 NOTE — Addendum Note (Signed)
Addended by: Renelda LomaHENRY, Nilah Belcourt on: 05/07/2018 12:35 PM   Modules accepted: Orders

## 2018-05-22 ENCOUNTER — Encounter: Payer: Self-pay | Admitting: Medical

## 2018-05-22 ENCOUNTER — Ambulatory Visit (INDEPENDENT_AMBULATORY_CARE_PROVIDER_SITE_OTHER): Payer: Medicare Other | Admitting: Medical

## 2018-05-22 VITALS — BP 152/90 | HR 74 | Temp 98.0°F | Ht 63.0 in | Wt 231.8 lb

## 2018-05-22 DIAGNOSIS — M545 Low back pain, unspecified: Secondary | ICD-10-CM

## 2018-05-22 DIAGNOSIS — R300 Dysuria: Secondary | ICD-10-CM

## 2018-05-22 DIAGNOSIS — R10819 Abdominal tenderness, unspecified site: Secondary | ICD-10-CM | POA: Diagnosis not present

## 2018-05-22 LAB — POCT URINALYSIS DIP (PROADVANTAGE DEVICE)
Bilirubin, UA: NEGATIVE
Blood, UA: NEGATIVE
Glucose, UA: NEGATIVE mg/dL
Ketones, POC UA: NEGATIVE mg/dL
Leukocytes, UA: NEGATIVE
Nitrite, UA: NEGATIVE
Protein Ur, POC: NEGATIVE mg/dL
Specific Gravity, Urine: 1.005
Urobilinogen, Ur: 3.5
pH, UA: 6 (ref 5.0–8.0)

## 2018-05-22 MED ORDER — HYDROCODONE-ACETAMINOPHEN 5-325 MG PO TABS: 1.0000 | ORAL_TABLET | Freq: Four times a day (QID) | ORAL | 0 refills | Status: DC | PRN

## 2018-05-22 NOTE — Addendum Note (Signed)
Addended by: Renelda LomaHENRY, Gavon Majano on: 05/22/2018 11:51 AM   Modules accepted: Orders

## 2018-05-22 NOTE — Progress Notes (Signed)
Subjective: Chief Complaint  Patient presents with  . other    pt says she was given an abx for uti and has been taking the med but still has no improvedment . pt also notice that when sshe has right hip area pain she has to go to the restroom. when she does it calms down pt says no other issues kneeling standing some issues when she walks drinking lots of water   Here today c/o ongoing urinary issues and hip pain.     She notes recent UTI 3 weeks ago.  Was put on Cipro by Dr. Susann GivensLalonde here.   However, still having pain in right hip when using the bathroom.   When she urinates the right thigh and hip pain improve.  No recent fever since last visit.   Drinks a lot of water.   No NVD, no constipation.    She does note urinary frequency.  No burning with urination.  No blood in the urine.   No vaginal discharge.   She has had UTI in the past, but no prior hip pain like this.   She notes falling about a year ago at the pharmacy but no issues since then.   That was a left leg injury.   Has had right hip xrays.  No other aggravating or relieving factors. No other complaint.    Past Medical History:  Diagnosis Date  . Allergy    RHINITIS  . Arthritis   . Colonic polyp   . Hemorrhoids   . Obesity   . Psoriasis   . Raynaud disease   . Thyroid disease    HYPOTHYROID   Current Outpatient Medications on File Prior to Visit  Medication Sig Dispense Refill  . ibuprofen (ADVIL,MOTRIN) 200 MG tablet Take 200 mg by mouth every 6 (six) hours as needed.    Marland Kitchen. levothyroxine (SYNTHROID, LEVOTHROID) 100 MCG tablet take 1 tablet by mouth every morning before breakfast 90 tablet 3  . Multiple Vitamins-Minerals (MULTIVITAMIN WITH MINERALS) tablet Take 1 tablet by mouth daily.       No current facility-administered medications on file prior to visit.    ROS as in subjective   Objective: BP (!) 152/90 (BP Location: Left Arm, Patient Position: Sitting)   Pulse 74   Temp 98 F (36.7 C)   Ht 5\' 3"   (1.6 m)   Wt 231 lb 12.8 oz (105.1 kg)   SpO2 98%   BMI 41.06 kg/m   Wt Readings from Last 3 Encounters:  05/22/18 231 lb 12.8 oz (105.1 kg)  05/07/18 230 lb (104.3 kg)  10/08/17 232 lb (105.2 kg)   BP Readings from Last 3 Encounters:  05/22/18 (!) 152/90  05/07/18 (!) 144/96  12/14/17 (!) 145/74   General appearance: alert, no distress, WD/WN, obese white female She is tender in the right lumbar paraspinal region, mild pain with back extension, back flexion without pain and relatively full, no other back tenderness or deformity She is mildly tender over the right greater trochanteric bursa, no obvious pain with range of motion of the hip, otherwise right leg nontender however when she is lying and flexes her hip she does note some tenderness in the upper thigh/groin.  Otherwise leg nontender Abdomen: +bs, soft, non tender, non distended, no masses, no hepatomegaly, no splenomegaly Pulses: 2+ symmetric, upper and lower extremities, normal cap refill 1+ bilateral nonpitting lower extremity edema    Assessment: Encounter Diagnoses  Name Primary?  . Acute right-sided low back pain  without sciatica Yes  . Dysuria   . Abdominal tenderness without rebound tenderness, unspecified location      Plan: Her symptoms and exam suggest more of a back strain and possible hip arthritis or bursitis although she is referring more to urinary tract symptoms or complaint.  Urinalysis reviewed, will send for culture. She has used 4 days of Cipro.  We discussed doing regular stretching and exercise in general once the symptoms improve  For now advised several days of relative rest, recommended calisthenics and stretching, can use heat and massage to the low back.  Medications: Norco short term for pain.   F/u with ortho if pain persists    Kathy Freeman was seen today for other.  Diagnoses and all orders for this visit:  Acute right-sided low back pain without sciatica -     Urine  Culture  Dysuria -     Urine Culture  Abdominal tenderness without rebound tenderness, unspecified location  Other orders -     HYDROcodone-acetaminophen (NORCO) 5-325 MG tablet; Take 1 tablet by mouth every 6 (six) hours as needed for moderate pain.

## 2018-05-24 ENCOUNTER — Other Ambulatory Visit: Payer: Self-pay | Admitting: Medical

## 2018-05-24 LAB — URINE CULTURE

## 2018-05-24 MED ORDER — NITROFURANTOIN MONOHYD MACRO 100 MG PO CAPS: 100.0000 mg | ORAL_CAPSULE | Freq: Two times a day (BID) | ORAL | 0 refills | Status: AC

## 2018-06-12 DIAGNOSIS — M722 Plantar fascial fibromatosis: Secondary | ICD-10-CM | POA: Diagnosis not present

## 2018-06-18 DIAGNOSIS — M79672 Pain in left foot: Secondary | ICD-10-CM | POA: Diagnosis not present

## 2018-06-18 DIAGNOSIS — M722 Plantar fascial fibromatosis: Secondary | ICD-10-CM | POA: Diagnosis not present

## 2018-06-19 DIAGNOSIS — M79672 Pain in left foot: Secondary | ICD-10-CM | POA: Diagnosis not present

## 2018-06-19 DIAGNOSIS — M722 Plantar fascial fibromatosis: Secondary | ICD-10-CM | POA: Diagnosis not present

## 2018-06-24 DIAGNOSIS — M79672 Pain in left foot: Secondary | ICD-10-CM | POA: Diagnosis not present

## 2018-06-24 DIAGNOSIS — M722 Plantar fascial fibromatosis: Secondary | ICD-10-CM | POA: Diagnosis not present

## 2018-06-25 DIAGNOSIS — H1711 Central corneal opacity, right eye: Secondary | ICD-10-CM | POA: Diagnosis not present

## 2018-06-26 DIAGNOSIS — M722 Plantar fascial fibromatosis: Secondary | ICD-10-CM | POA: Diagnosis not present

## 2018-06-26 DIAGNOSIS — M79672 Pain in left foot: Secondary | ICD-10-CM | POA: Diagnosis not present

## 2018-07-02 DIAGNOSIS — M79672 Pain in left foot: Secondary | ICD-10-CM | POA: Diagnosis not present

## 2018-07-02 DIAGNOSIS — M722 Plantar fascial fibromatosis: Secondary | ICD-10-CM | POA: Diagnosis not present

## 2018-07-04 DIAGNOSIS — M722 Plantar fascial fibromatosis: Secondary | ICD-10-CM | POA: Diagnosis not present

## 2018-07-04 DIAGNOSIS — M79672 Pain in left foot: Secondary | ICD-10-CM | POA: Diagnosis not present

## 2018-07-09 DIAGNOSIS — M722 Plantar fascial fibromatosis: Secondary | ICD-10-CM | POA: Diagnosis not present

## 2018-07-09 DIAGNOSIS — M79672 Pain in left foot: Secondary | ICD-10-CM | POA: Diagnosis not present

## 2018-07-11 DIAGNOSIS — M722 Plantar fascial fibromatosis: Secondary | ICD-10-CM | POA: Diagnosis not present

## 2018-07-11 DIAGNOSIS — M79672 Pain in left foot: Secondary | ICD-10-CM | POA: Diagnosis not present

## 2018-07-16 DIAGNOSIS — M722 Plantar fascial fibromatosis: Secondary | ICD-10-CM | POA: Diagnosis not present

## 2018-07-16 DIAGNOSIS — M79672 Pain in left foot: Secondary | ICD-10-CM | POA: Diagnosis not present

## 2018-07-18 DIAGNOSIS — M79672 Pain in left foot: Secondary | ICD-10-CM | POA: Diagnosis not present

## 2018-07-18 DIAGNOSIS — M722 Plantar fascial fibromatosis: Secondary | ICD-10-CM | POA: Diagnosis not present

## 2018-08-24 ENCOUNTER — Other Ambulatory Visit: Payer: Self-pay | Admitting: Family Medicine

## 2018-08-24 DIAGNOSIS — E038 Other specified hypothyroidism: Secondary | ICD-10-CM

## 2018-08-26 NOTE — Telephone Encounter (Signed)
Called pt to advise of med check or AWV needed. We are booked out far so 90 script of thyroid med will be sent in for pt to cover her until we get pt seen. KH

## 2018-09-16 ENCOUNTER — Encounter: Payer: Self-pay | Admitting: Family Medicine

## 2018-09-16 ENCOUNTER — Ambulatory Visit (INDEPENDENT_AMBULATORY_CARE_PROVIDER_SITE_OTHER): Payer: Medicare Other | Admitting: Family Medicine

## 2018-09-16 VITALS — BP 142/88 | HR 72 | Temp 97.8°F | Wt 231.8 lb

## 2018-09-16 DIAGNOSIS — E785 Hyperlipidemia, unspecified: Secondary | ICD-10-CM

## 2018-09-16 DIAGNOSIS — Z23 Encounter for immunization: Secondary | ICD-10-CM | POA: Diagnosis not present

## 2018-09-16 DIAGNOSIS — E038 Other specified hypothyroidism: Secondary | ICD-10-CM

## 2018-09-16 DIAGNOSIS — M1990 Unspecified osteoarthritis, unspecified site: Secondary | ICD-10-CM | POA: Diagnosis not present

## 2018-09-16 DIAGNOSIS — Z8669 Personal history of other diseases of the nervous system and sense organs: Secondary | ICD-10-CM | POA: Insufficient documentation

## 2018-09-16 DIAGNOSIS — J301 Allergic rhinitis due to pollen: Secondary | ICD-10-CM | POA: Diagnosis not present

## 2018-09-16 DIAGNOSIS — L409 Psoriasis, unspecified: Secondary | ICD-10-CM

## 2018-09-16 MED ORDER — LEVOTHYROXINE SODIUM 100 MCG PO TABS: 100.0000 ug | ORAL_TABLET | Freq: Every day | ORAL | 3 refills | Status: DC

## 2018-09-16 NOTE — Patient Instructions (Signed)
Try Rhinocort nasal spray instead of Flonase

## 2018-09-16 NOTE — Progress Notes (Signed)
   Subjective:    Patient ID: Kathy Freeman, female    DOB: Oct 06, 1941, 77 y.o.   MRN: [id number]  HPI She is here for an interval evaluation.  She does have hypothyroidism and is presently doing quite nicely on her Synthroid.  Her allergies seem to be under fairly good control with Claritin and she also uses Advil.  She has tried Flonase in the past but it caused nasal irritation.  She does have psoriasis and uses topical cortisone cream with good result.  There was a history of Raynaud's disease however further discussion with her states that she has not had any symptoms suggestive of that.  She has had her shingles shot.  She does have hyperlipidemia but presently is not on any medication.  Her arthritis is not causing her any trouble.  She does not want a DEXA scan, mammogram. She does have a previous history of left mastoiditis and still has difficulty with hearing from that side. Allergies and social history was reviewed Review of Systems     Objective:   Physical Exam Alert and in no distress. Tympanic membrane on the left is reddish with poor landmarks and perforation is noted.  Right TM and canal normal canals are normal. Pharyngeal area is normal. Neck is supple without adenopathy or thyromegaly. Cardiac exam shows a regular sinus rhythm without murmurs or gallops. Lungs are clear to auscultation.        Assessment & Plan:  .Other specified hypothyroidism - Plan: TSH, levothyroxine (SYNTHROID, LEVOTHROID) 100 MCG tablet  Non-seasonal allergic rhinitis due to pollen  Arthritis  Hyperlipidemia LDL goal <130 - Plan: Lipid panel  Psoriasis  History of mastoiditis  Need for influenza vaccination - Plan: Flu vaccine HIGH DOSE PF (Fluzone High dose) Encouraged her to continue to take good care of herself.  Will also get documentation of her Shingrix vaccine.

## 2018-09-17 LAB — TSH: TSH: 1.26 u[IU]/mL (ref 0.450–4.500)

## 2018-09-17 LAB — LIPID PANEL
Chol/HDL Ratio: 4 ratio (ref 0.0–4.4)
Cholesterol, Total: 206 mg/dL — ABNORMAL HIGH (ref 100–199)
HDL: 51 mg/dL (ref >39)
LDL Calculated: 136 mg/dL — ABNORMAL HIGH (ref 0–99)
Triglycerides: 96 mg/dL (ref 0–149)
VLDL Cholesterol Cal: 19 mg/dL (ref 5–40)

## 2018-10-25 DIAGNOSIS — G63 Polyneuropathy in diseases classified elsewhere: Secondary | ICD-10-CM | POA: Diagnosis not present

## 2018-12-02 ENCOUNTER — Other Ambulatory Visit: Payer: Self-pay | Admitting: Family Medicine

## 2018-12-02 DIAGNOSIS — E038 Other specified hypothyroidism: Secondary | ICD-10-CM

## 2019-05-05 ENCOUNTER — Encounter: Payer: Self-pay | Admitting: Family Medicine

## 2019-05-05 ENCOUNTER — Ambulatory Visit (INDEPENDENT_AMBULATORY_CARE_PROVIDER_SITE_OTHER): Payer: Medicare Other | Admitting: Family Medicine

## 2019-05-05 ENCOUNTER — Other Ambulatory Visit: Payer: Self-pay

## 2019-05-05 VITALS — Wt 231.0 lb

## 2019-05-05 DIAGNOSIS — J301 Allergic rhinitis due to pollen: Secondary | ICD-10-CM

## 2019-05-05 NOTE — Progress Notes (Signed)
   Subjective:    Patient ID: Kathy Freeman, female    DOB: [birth year], 78 y.o.   MRN: [id number]  HPI Documentation for virtual telephone encounter.  Documentation for virtual audio and video telecommunications through [place] encounter: The patient was located at home. The provider was located in the office. The patient did consent to this visit and is aware of possible charges through their insurance for this visit. The other persons participating in this telemedicine service were none. Time spent on call was 47minutes and in review of previous records >10 minutes total. She did not have a smart phone therefore a video visit was not possible. This virtual service is not related to other E/M service within previous 7 days. She complains of a several month history of itchy watery eyes, rhinorrhea, slight cough.  She states that the Claritin lasts only several hours.  No fever, chills, sore throat, earache, change in smell or taste.    Review of Systems     Objective:   Physical Exam Alert and in no distress.  Her voice is normal.      Assessment & Plan:  Non-seasonal allergic rhinitis due to pollen - Plan: I recommended that she switch to Allegra and also use Rhinocort.  She is to call me in 2 weeks to let me know how she is doing.  I explained that I did not think this was an infection or related to [place].

## 2019-06-24 DIAGNOSIS — H52203 Unspecified astigmatism, bilateral: Secondary | ICD-10-CM | POA: Diagnosis not present

## 2019-06-24 DIAGNOSIS — H1789 Other corneal scars and opacities: Secondary | ICD-10-CM | POA: Diagnosis not present

## 2019-06-24 DIAGNOSIS — H25813 Combined forms of age-related cataract, bilateral: Secondary | ICD-10-CM | POA: Diagnosis not present

## 2019-07-17 DIAGNOSIS — G63 Polyneuropathy in diseases classified elsewhere: Secondary | ICD-10-CM | POA: Diagnosis not present

## 2019-08-15 ENCOUNTER — Telehealth: Payer: Self-pay

## 2019-08-15 NOTE — Telephone Encounter (Signed)
Pt was made appt . [place]

## 2019-08-15 NOTE — Telephone Encounter (Signed)
I need to see her to assess her vascular status

## 2019-08-15 NOTE — Telephone Encounter (Signed)
Pt nurse called to advise office of abnormal PAD screen right normal 1.09 left foot abnormal 0.20 . Pt also may have UTI advised to call to bring in U/A sample after calling to schedule a nurse visit. Please advise .[place]

## 2019-08-19 ENCOUNTER — Other Ambulatory Visit: Payer: Self-pay

## 2019-08-19 ENCOUNTER — Ambulatory Visit: Payer: Medicare Other | Admitting: Family Medicine

## 2019-08-19 ENCOUNTER — Telehealth: Payer: Self-pay | Admitting: Family Medicine

## 2019-08-19 ENCOUNTER — Other Ambulatory Visit (INDEPENDENT_AMBULATORY_CARE_PROVIDER_SITE_OTHER): Payer: Medicare Other

## 2019-08-19 DIAGNOSIS — R300 Dysuria: Secondary | ICD-10-CM | POA: Diagnosis not present

## 2019-08-19 LAB — POCT URINALYSIS DIP (PROADVANTAGE DEVICE)
Bilirubin, UA: NEGATIVE
Blood, UA: NEGATIVE
Glucose, UA: NEGATIVE mg/dL
Ketones, POC UA: NEGATIVE mg/dL
Leukocytes, UA: NEGATIVE
Nitrite, UA: NEGATIVE
Protein Ur, POC: NEGATIVE mg/dL
Specific Gravity, Urine: 1.01
Urobilinogen, Ur: NEGATIVE
pH, UA: 7 (ref 5.0–8.0)

## 2019-08-19 NOTE — Telephone Encounter (Signed)
When I called pt for check in she answered screening positive for chills, SOB. Per Dr. Redmond School pt can't be seen until tested for COVID.  Pt was given urine cup and instructions to go be tested

## 2019-11-23 ENCOUNTER — Other Ambulatory Visit: Payer: Self-pay | Admitting: Family Medicine

## 2019-11-23 DIAGNOSIS — E038 Other specified hypothyroidism: Secondary | ICD-10-CM

## 2019-12-05 ENCOUNTER — Other Ambulatory Visit: Payer: Self-pay

## 2019-12-05 ENCOUNTER — Encounter: Payer: Self-pay | Admitting: Family Medicine

## 2019-12-05 ENCOUNTER — Ambulatory Visit (INDEPENDENT_AMBULATORY_CARE_PROVIDER_SITE_OTHER): Payer: Medicare Other | Admitting: Family Medicine

## 2019-12-05 VITALS — HR 78 | Temp 98.3°F | Wt 235.0 lb

## 2019-12-05 DIAGNOSIS — J209 Acute bronchitis, unspecified: Secondary | ICD-10-CM | POA: Diagnosis not present

## 2019-12-05 NOTE — Progress Notes (Signed)
   Subjective:    Patient ID: Kathy Freeman, female    DOB: [birth year], 79 y.o.   MRN: [id number]  HPI Documentation for virtual telephone encounter.  Documentation for virtual audio and video telecommunications through [place] encounter: The patient was located at home. The provider was located in the office. The patient did consent to this visit and is aware of possible charges through their insurance for this visit. The other persons participating in this telemedicine service were none.  This virtual service is not related to other E/M service within previous 7 days. She continues to complain of over a 4-month history of cough, congestion, PND but no fever, chills, fatigue or malaise. She does have underlying seasonal allergies.  Review of Systems     Objective:   Physical Exam Alert and in no distress otherwise not examined       Assessment & Plan:  Acute bronchitis, unspecified organism - Plan: amoxicillin-clavulanate (AUGMENTIN) 875-125 MG tablet I think it is reasonable to treated with an antibiotic to see if this will work.  She will call me when she finishes the antibiotic if not totally back to normal.

## 2019-12-06 MED ORDER — AMOXICILLIN-POT CLAVULANATE 875-125 MG PO TABS: 1.0000 | ORAL_TABLET | Freq: Two times a day (BID) | ORAL | 0 refills | Status: DC

## 2019-12-22 ENCOUNTER — Telehealth: Payer: Self-pay | Admitting: Family Medicine

## 2019-12-22 NOTE — Telephone Encounter (Signed)
Pt called and said the Augmentin that was prescribed was too strong for her she has been taking a half pill instead. She wants to see if she could get something else because she is having chills,headaches and now discharge

## 2019-12-22 NOTE — Telephone Encounter (Signed)
Need more information.

## 2019-12-24 NOTE — Telephone Encounter (Signed)
Called pt to find out how she is feeling. No answer and lvm. KH

## 2019-12-25 MED ORDER — CLARITHROMYCIN 500 MG PO TABS: 500.0000 mg | ORAL_TABLET | Freq: Two times a day (BID) | ORAL | 0 refills | Status: DC

## 2019-12-25 MED ORDER — FLUCONAZOLE 150 MG PO TABS: 150.0000 mg | ORAL_TABLET | Freq: Once | ORAL | 0 refills | Status: AC

## 2019-12-25 NOTE — Telephone Encounter (Signed)
Pt was advised KH 

## 2019-12-25 NOTE — Addendum Note (Signed)
Addended by: Ronnald Nian on: 12/25/2019 11:39 AM   Modules accepted: Orders

## 2019-12-25 NOTE — Telephone Encounter (Signed)
She thinks that the antibiotic is causing difficulty.  The safest thing would be to switch her to a different medicine and also give her some Diflucan.

## 2019-12-25 NOTE — Telephone Encounter (Signed)
Please advise if pt should take a different abx. Pt says she felt bad , chest tightness, headaches. Pt took half a tablet and did not feel any better and now has a discharge. Please advise if she needs a new abx and please advise if something could be called in for discharge. KH

## 2019-12-25 NOTE — Telephone Encounter (Signed)
Let her know that I called a different antibiotic and and also something for the discharge.

## 2020-02-09 ENCOUNTER — Other Ambulatory Visit: Payer: Self-pay

## 2020-02-09 ENCOUNTER — Telehealth: Payer: Self-pay | Admitting: Family Medicine

## 2020-02-09 ENCOUNTER — Encounter: Payer: Self-pay | Admitting: Family Medicine

## 2020-02-09 ENCOUNTER — Ambulatory Visit (INDEPENDENT_AMBULATORY_CARE_PROVIDER_SITE_OTHER): Payer: Medicare Other | Admitting: Family Medicine

## 2020-02-09 VITALS — BP 148/90 | HR 94 | Temp 98.0°F | Wt 242.0 lb

## 2020-02-09 DIAGNOSIS — R350 Frequency of micturition: Secondary | ICD-10-CM

## 2020-02-09 DIAGNOSIS — N393 Stress incontinence (female) (male): Secondary | ICD-10-CM

## 2020-02-09 LAB — POCT URINALYSIS DIP (PROADVANTAGE DEVICE)
Bilirubin, UA: NEGATIVE
Blood, UA: NEGATIVE
Glucose, UA: NEGATIVE mg/dL
Ketones, POC UA: NEGATIVE mg/dL
Nitrite, UA: NEGATIVE
Protein Ur, POC: NEGATIVE mg/dL
Specific Gravity, Urine: 1.01
Urobilinogen, Ur: 0.2
pH, UA: 6.5 (ref 5.0–8.0)

## 2020-02-09 NOTE — Progress Notes (Signed)
   Subjective:    Patient ID: Kathy Freeman, female    DOB: February 14, 1941, 79 y.o.   MRN: [id number]  HPI She complains of a smell to her urine as well as frequency and some incontinence but she describes incontinence as with coughing and sneezing.  She does use pads for this.  No fever, chills, abdominal pain.  She has had this pain for roughly 1 week.   Review of Systems     Objective:   Physical Exam  Alert and in no distress.  Urine dipstick did show leukocytes however microscopic was contaminated.  Her specific gravity was low      Assessment & Plan:  Urinary frequency  Stress incontinence Recommend she cut back on her fluids to decrease her urinary frequency and continue to use the pads.  She was comfortable with this.

## 2020-02-09 NOTE — Telephone Encounter (Signed)
error 

## 2020-02-09 NOTE — Addendum Note (Signed)
Addended by: Renelda Loma on: 02/09/2020 05:37 PM   Modules accepted: Orders

## 2020-02-21 ENCOUNTER — Other Ambulatory Visit: Payer: Self-pay | Admitting: Family Medicine

## 2020-02-21 DIAGNOSIS — E038 Other specified hypothyroidism: Secondary | ICD-10-CM

## 2020-05-27 ENCOUNTER — Ambulatory Visit (INDEPENDENT_AMBULATORY_CARE_PROVIDER_SITE_OTHER): Payer: Medicare Other | Admitting: Family Medicine

## 2020-05-27 ENCOUNTER — Other Ambulatory Visit: Payer: Self-pay

## 2020-05-27 ENCOUNTER — Encounter: Payer: Self-pay | Admitting: Family Medicine

## 2020-05-27 VITALS — BP 140/90 | HR 96 | Temp 98.2°F | Wt 240.2 lb

## 2020-05-27 DIAGNOSIS — R609 Edema, unspecified: Secondary | ICD-10-CM | POA: Diagnosis not present

## 2020-05-27 DIAGNOSIS — R202 Paresthesia of skin: Secondary | ICD-10-CM | POA: Diagnosis not present

## 2020-05-27 DIAGNOSIS — R0989 Other specified symptoms and signs involving the circulatory and respiratory systems: Secondary | ICD-10-CM

## 2020-05-27 DIAGNOSIS — G629 Polyneuropathy, unspecified: Secondary | ICD-10-CM | POA: Diagnosis not present

## 2020-05-27 MED ORDER — DULOXETINE HCL 30 MG PO CPEP: 30.0000 mg | ORAL_CAPSULE | Freq: Every day | ORAL | 1 refills | Status: DC

## 2020-05-27 NOTE — Patient Instructions (Signed)
Take the Cymbalta on a full stomach with a full glass of water.

## 2020-05-27 NOTE — Progress Notes (Signed)
Subjective:    Patient ID: Kathy Freeman, female    DOB: [birth year], 79 y.o.   MRN: [id number]  HPI Chief Complaint  Patient presents with  . foot pain.    left foot pain to ankle up leg and then to up to bottom   Here with complaints of both her feet tingling worse on the left for the past 2 months. States both of her legs are hurting all the way from her feet to her buttocks. This is ongoing and not new. States she has seen her orthopedist for this in the past. States she has Hydrocodone at home but has not taken it. States she is afraid of it.  States she has taken Tramadol and this did not help her "nerve pain".   States Dr. Victorino Dike diagnosed her with neuropathy. States she also has arthritis.   States she went to the [employer] and bought some supportive shoes.   Has lost 2 lbs. Walking more lately and working in the house.   States she has been sweating more than usual.   Hx of plantar fascitis but states this does not feel like that type of pain.   Taking 2 Advil every 3 hours during the day and using Biofreeze. Also uses Aspercreme but it does not help.   Denies fever, chills, dizziness, chest pain, palpitations, abdominal pain, N/V/D or urinary symptoms.   Requests pain medication.   Reviewed allergies, medications, past medical, surgical, family, and social history.   Review of Systems Pertinent positives and negatives in the history of present illness.     Objective:   Physical Exam Constitutional:      General: She is not in acute distress.    Appearance: Normal appearance. She is obese. She is not ill-appearing.  Cardiovascular:     Rate and Rhythm: Normal rate and regular rhythm.     Pulses: Normal pulses.  Pulmonary:     Effort: Pulmonary effort is normal.     Breath sounds: Normal breath sounds.  Musculoskeletal:     Right lower leg: 1+ Edema present.     Left lower leg: 1+ Edema present.     Right foot: Normal range of motion and normal  capillary refill. Swelling present. Abnormal pulse.     Left foot: Normal range of motion. Swelling present. Abnormal pulse.  Feet:     Right foot:     Skin integrity: Skin integrity normal.     Left foot:     Skin integrity: Skin integrity normal.  Skin:    General: Skin is warm and dry.     Capillary Refill: Capillary refill takes less than 2 seconds.  Neurological:     General: No focal deficit present.     Mental Status: She is alert and oriented to person, place, and time.    BP 140/90   Pulse 96   Temp 98.2 F (36.8 C)   Wt 240 lb 3.2 oz (109 kg)   BMI 42.55 kg/m        Assessment & Plan:  Tingling of both feet - Plan: VAS [place] ABI WITH/WO TBI, VAS [place] LOWER EXTREMITY ARTERIAL DUPLEX, CBC with Differential/Platelet, Comprehensive metabolic panel, Vitamin B12  Dependent edema - Plan: VAS [place] ABI WITH/WO TBI, VAS [place] LOWER EXTREMITY ARTERIAL DUPLEX  Decreased pulses in feet - Plan: VAS [place] ABI WITH/WO TBI, VAS [place] LOWER EXTREMITY ARTERIAL DUPLEX  Polyneuropathy - Plan: DULoxetine (CYMBALTA) 30 MG capsule  No red flag symptoms.  She has good sensation and temperature of her feet. No asymmetry of the legs suggesting obstruction or DVT. Pulses are decreased and worse on the left.  I will order ABIs and start her on Cymbalta low dose. Discussed possible side effects and when to stop the medication. Elevated her legs when sitting.  She will follow up with her PCP Dr. Susann Givens in 2 weeks since I will be out of the office.

## 2020-05-28 LAB — COMPREHENSIVE METABOLIC PANEL
ALT: 16 IU/L (ref 0–32)
AST: 26 IU/L (ref 0–40)
Albumin/Globulin Ratio: 2 (ref 1.2–2.2)
Albumin: 4.5 g/dL (ref 3.7–4.7)
Alkaline Phosphatase: 89 IU/L (ref 48–121)
BUN/Creatinine Ratio: 22 (ref 12–28)
BUN: 19 mg/dL (ref 8–27)
Bilirubin Total: 0.4 mg/dL (ref 0.0–1.2)
CO2: 27 mmol/L (ref 20–29)
Calcium: 9 mg/dL (ref 8.7–10.3)
Chloride: 107 mmol/L — ABNORMAL HIGH (ref 96–106)
Creatinine, Ser: 0.86 mg/dL (ref 0.57–1.00)
GFR calc Af Amer: 75 mL/min/{1.73_m2} (ref >59)
GFR calc non Af Amer: 65 mL/min/{1.73_m2} (ref >59)
Globulin, Total: 2.3 g/dL (ref 1.5–4.5)
Glucose: 94 mg/dL (ref 65–99)
Potassium: 4.7 mmol/L (ref 3.5–5.2)
Sodium: 146 mmol/L — ABNORMAL HIGH (ref 134–144)
Total Protein: 6.8 g/dL (ref 6.0–8.5)

## 2020-05-28 LAB — CBC WITH DIFFERENTIAL/PLATELET
Basophils Absolute: 0 10*3/uL (ref 0.0–0.2)
Basos: 1 %
EOS (ABSOLUTE): 0.2 10*3/uL (ref 0.0–0.4)
Eos: 2 %
Hematocrit: 38.4 % (ref 34.0–46.6)
Hemoglobin: 13.2 g/dL (ref 11.1–15.9)
Immature Grans (Abs): 0.1 10*3/uL (ref 0.0–0.1)
Immature Granulocytes: 1 %
Lymphocytes Absolute: 2 10*3/uL (ref 0.7–3.1)
Lymphs: 23 %
MCH: 29.1 pg (ref 26.6–33.0)
MCHC: 34.4 g/dL (ref 31.5–35.7)
MCV: 85 fL (ref 79–97)
Monocytes Absolute: 0.6 10*3/uL (ref 0.1–0.9)
Monocytes: 7 %
Neutrophils Absolute: 5.8 10*3/uL (ref 1.4–7.0)
Neutrophils: 66 %
Platelets: 313 10*3/uL (ref 150–450)
RBC: 4.54 x10E6/uL (ref 3.77–5.28)
RDW: 13 % (ref 11.7–15.4)
WBC: 8.7 10*3/uL (ref 3.4–10.8)

## 2020-05-28 LAB — VITAMIN B12: Vitamin B-12: 841 pg/mL (ref 232–1245)

## 2020-05-31 ENCOUNTER — Encounter: Payer: Self-pay | Admitting: Family Medicine

## 2020-06-08 ENCOUNTER — Encounter: Payer: Self-pay | Admitting: Family Medicine

## 2020-06-08 ENCOUNTER — Ambulatory Visit (INDEPENDENT_AMBULATORY_CARE_PROVIDER_SITE_OTHER): Payer: Medicare Other | Admitting: Family Medicine

## 2020-06-08 VITALS — BP 140/92 | HR 85 | Temp 97.5°F | Wt 239.6 lb

## 2020-06-08 DIAGNOSIS — R202 Paresthesia of skin: Secondary | ICD-10-CM

## 2020-06-08 NOTE — Progress Notes (Signed)
   Subjective:    Patient ID: Kathy Freeman, female    DOB: 05/13/1941, 79 y.o.   MRN: [id number]  HPI He is here for consult concerning continued difficulty with tingling in her feet.  She was given duloxetine but read the insert and was scared about all the side effects and decided not to take it.  She has been using a multivitamin and states that the tingling sensation has been slowly going away over the last several months.   Review of Systems     Objective:   Physical Exam Alert and in no distress otherwise not examined       Assessment & Plan:  Tingling of both feet I discussed the Cymbalta with her and explained that the possible side effects are quite minimal however since she seems to be responding to the multivitamin recommend that she hold off on taking the duloxetine until such time that she stabilizes on the multivitamin.  Hopefully this will take care of her symptoms without the need of the medication.  She was comfortable with that.

## 2020-06-16 DIAGNOSIS — G9009 Other idiopathic peripheral autonomic neuropathy: Secondary | ICD-10-CM | POA: Diagnosis not present

## 2020-06-22 ENCOUNTER — Encounter (HOSPITAL_COMMUNITY): Payer: Self-pay

## 2020-07-08 DIAGNOSIS — H52203 Unspecified astigmatism, bilateral: Secondary | ICD-10-CM | POA: Diagnosis not present

## 2020-07-08 DIAGNOSIS — H2513 Age-related nuclear cataract, bilateral: Secondary | ICD-10-CM | POA: Diagnosis not present

## 2020-07-08 DIAGNOSIS — H1789 Other corneal scars and opacities: Secondary | ICD-10-CM | POA: Diagnosis not present

## 2020-07-09 ENCOUNTER — Encounter (HOSPITAL_COMMUNITY): Payer: Self-pay

## 2020-12-01 ENCOUNTER — Encounter: Payer: Self-pay | Admitting: Family Medicine

## 2020-12-01 ENCOUNTER — Other Ambulatory Visit: Payer: Self-pay

## 2020-12-01 ENCOUNTER — Other Ambulatory Visit (INDEPENDENT_AMBULATORY_CARE_PROVIDER_SITE_OTHER): Payer: Medicare Other

## 2020-12-01 ENCOUNTER — Telehealth (INDEPENDENT_AMBULATORY_CARE_PROVIDER_SITE_OTHER): Payer: Medicare Other | Admitting: Family Medicine

## 2020-12-01 VITALS — Ht 62.0 in | Wt 239.0 lb

## 2020-12-01 DIAGNOSIS — R059 Cough, unspecified: Secondary | ICD-10-CM

## 2020-12-01 DIAGNOSIS — R0602 Shortness of breath: Secondary | ICD-10-CM

## 2020-12-01 DIAGNOSIS — U071 COVID-19: Secondary | ICD-10-CM | POA: Diagnosis not present

## 2020-12-01 LAB — POC COVID19 BINAXNOW: SARS Coronavirus 2 Ag: POSITIVE — AB

## 2020-12-01 NOTE — Progress Notes (Addendum)
   Subjective:    Patient ID: Kathy Freeman, female    DOB: 1940/12/14, 80 y.o.   MRN: [id number]  HPI I connected with  Feleica A Poyser on 12/01/20 by a video enabled telemedicine application and verified that I am speaking with the correct person using two identifiers.  Patient unable to do video.  The encounter was carried out by phone.VV:OHYWVP patient at home I discussed the limitations of evaluation and management by telemedicine. The patient expressed understanding and agreed to proceed. She states that Saturday she noted some shortness of breath, fatigue and noted yesterday coughing and chills.  No smell or taste change, sore throat.   Review of Systems     Objective:   Physical Exam Alert and in no distress otherwise not examined       Assessment & Plan:  Shortness of breath - Plan: POC COVID-19 BinaxNow, Novel Coronavirus, NAA ([facility])  Cough - Plan: POC COVID-19 BinaxNow, Novel Coronavirus, NAA ([facility])  COVID Discussed the diagnosis of COVID with her.  Recommend conservative care at the present time. Over 20 minutes spent today reviewing her medical record, history, documentation and counseling. The COVID test came back positive.  We will set her up for the IV antibiotic.  Left a message for her concerning this and if she has difficulty with increasing cough, shortness of breath, to let us know.

## 2020-12-01 NOTE — Addendum Note (Signed)
Addended by: Ronnald Nian on: 12/01/2020 02:37 PM   Modules accepted: Orders

## 2020-12-02 ENCOUNTER — Telehealth: Payer: Self-pay

## 2020-12-02 NOTE — Telephone Encounter (Signed)
Called to discuss with patient about COVID-19 symptoms and the use of one of the available treatments for those with mild to moderate Covid symptoms and at a high risk of hospitalization.  Pt appears to qualify for outpatient treatment due to co-morbid conditions and/or a member of an at-risk group in accordance with the FDA Emergency Use Authorization.    Symptom onset: Unknown Vaccinated: Unknown Booster? Unknown Immunocompromised? No Qualifiers: None  Unable to reach pt - Left message and call back number [phone].Adjuster's number is not in service.   Esther Hardy

## 2021-02-01 ENCOUNTER — Other Ambulatory Visit: Payer: Self-pay

## 2021-02-01 ENCOUNTER — Ambulatory Visit (INDEPENDENT_AMBULATORY_CARE_PROVIDER_SITE_OTHER): Payer: Medicare Other | Admitting: Family Medicine

## 2021-02-01 ENCOUNTER — Encounter: Payer: Self-pay | Admitting: Family Medicine

## 2021-02-01 VITALS — BP 148/88 | HR 80 | Temp 98.7°F | Wt 239.2 lb

## 2021-02-01 DIAGNOSIS — M62830 Muscle spasm of back: Secondary | ICD-10-CM | POA: Diagnosis not present

## 2021-02-01 DIAGNOSIS — M1990 Unspecified osteoarthritis, unspecified site: Secondary | ICD-10-CM | POA: Diagnosis not present

## 2021-02-01 DIAGNOSIS — E038 Other specified hypothyroidism: Secondary | ICD-10-CM | POA: Diagnosis not present

## 2021-02-01 DIAGNOSIS — G629 Polyneuropathy, unspecified: Secondary | ICD-10-CM | POA: Insufficient documentation

## 2021-02-01 DIAGNOSIS — Z8616 Personal history of COVID-19: Secondary | ICD-10-CM | POA: Diagnosis not present

## 2021-02-01 NOTE — Patient Instructions (Signed)
You can take 3 Advil 3 or 4 times per day.  Use heat to your back for 20 minutes 3 times per day and gentle stretching after that time

## 2021-02-01 NOTE — Progress Notes (Signed)
   Subjective:    Patient ID: Kathy Freeman, female    DOB: 01-03-1941, 80 y.o.   MRN: [id number]  HPI She is here for evaluation of spasms.  She states that that can occur anywhere in her back area has been there for the last week.  She has occasionally taken an Advil.  She cannot take Tylenol as she has adverse reaction from that.  She has a previous history of COVID but states that she cannot relate it to the same timeframe.  She is also been seen by orthopedics and told she had a peripheral neuropathy but presently is not taking anything.  See note from Dr. Victorino Dike. does have a history of arthritis and orthopedics has given her tramadol to help with that.  She also is on Synthroid and has not had blood work recently on that.   Review of Systems     Objective:   Physical Exam Alert and complaining of back pain.  She complains of palpable tenderness to her entire back area.  Limitation of range of motion with flexion extension as well as lateral motion.  Negative straight leg raising.  Normal hip motion.      Assessment & Plan:  Back spasm - Plan: CBC with Differential/Platelet, Comprehensive metabolic panel  Polyneuropathy  Other specified hypothyroidism - Plan: TSH I will do routine blood studies on her to see if anything shows up otherwise I will work with heat, stretching and range of motion exercises.

## 2021-02-02 LAB — COMPREHENSIVE METABOLIC PANEL
ALT: 14 IU/L (ref 0–32)
AST: 21 IU/L (ref 0–40)
Albumin/Globulin Ratio: 1.8 (ref 1.2–2.2)
Albumin: 4.3 g/dL (ref 3.7–4.7)
Alkaline Phosphatase: 104 IU/L (ref 44–121)
BUN/Creatinine Ratio: 23 (ref 12–28)
BUN: 19 mg/dL (ref 8–27)
Bilirubin Total: 0.4 mg/dL (ref 0.0–1.2)
CO2: 25 mmol/L (ref 20–29)
Calcium: 9.6 mg/dL (ref 8.7–10.3)
Chloride: 101 mmol/L (ref 96–106)
Creatinine, Ser: 0.84 mg/dL (ref 0.57–1.00)
Globulin, Total: 2.4 g/dL (ref 1.5–4.5)
Glucose: 93 mg/dL (ref 65–99)
Potassium: 5 mmol/L (ref 3.5–5.2)
Sodium: 143 mmol/L (ref 134–144)
Total Protein: 6.7 g/dL (ref 6.0–8.5)
eGFR: 71 mL/min/{1.73_m2} (ref >59)

## 2021-02-02 LAB — CBC WITH DIFFERENTIAL/PLATELET
Basophils Absolute: 0.1 10*3/uL (ref 0.0–0.2)
Basos: 1 %
EOS (ABSOLUTE): 0.2 10*3/uL (ref 0.0–0.4)
Eos: 3 %
Hematocrit: 41.6 % (ref 34.0–46.6)
Hemoglobin: 13.5 g/dL (ref 11.1–15.9)
Immature Grans (Abs): 0 10*3/uL (ref 0.0–0.1)
Immature Granulocytes: 0 %
Lymphocytes Absolute: 1.7 10*3/uL (ref 0.7–3.1)
Lymphs: 24 %
MCH: 28.2 pg (ref 26.6–33.0)
MCHC: 32.5 g/dL (ref 31.5–35.7)
MCV: 87 fL (ref 79–97)
Monocytes Absolute: 0.5 10*3/uL (ref 0.1–0.9)
Monocytes: 7 %
Neutrophils Absolute: 4.7 10*3/uL (ref 1.4–7.0)
Neutrophils: 65 %
Platelets: 334 10*3/uL (ref 150–450)
RBC: 4.79 x10E6/uL (ref 3.77–5.28)
RDW: 13.2 % (ref 11.7–15.4)
WBC: 7.1 10*3/uL (ref 3.4–10.8)

## 2021-02-02 LAB — TSH: TSH: 0.931 u[IU]/mL (ref 0.450–4.500)

## 2021-02-11 ENCOUNTER — Encounter: Payer: Self-pay | Admitting: Family Medicine

## 2021-02-11 ENCOUNTER — Ambulatory Visit (INDEPENDENT_AMBULATORY_CARE_PROVIDER_SITE_OTHER): Payer: Medicare Other | Admitting: Family Medicine

## 2021-02-11 ENCOUNTER — Ambulatory Visit
Admission: RE | Admit: 2021-02-11 | Discharge: 2021-02-11 | Disposition: A | Discharge status: Home / Self Care | Payer: Medicare Other | Source: Ambulatory Visit | Attending: Family Medicine | Admitting: Family Medicine

## 2021-02-11 ENCOUNTER — Other Ambulatory Visit: Payer: Self-pay

## 2021-02-11 VITALS — BP 148/80 | HR 64 | Temp 99.5°F | Wt 241.0 lb

## 2021-02-11 DIAGNOSIS — R1030 Lower abdominal pain, unspecified: Secondary | ICD-10-CM | POA: Diagnosis not present

## 2021-02-11 DIAGNOSIS — M545 Low back pain, unspecified: Secondary | ICD-10-CM

## 2021-02-11 LAB — POCT URINALYSIS DIP (PROADVANTAGE DEVICE)
Bilirubin, UA: NEGATIVE
Blood, UA: NEGATIVE
Glucose, UA: NEGATIVE mg/dL
Ketones, POC UA: NEGATIVE mg/dL
Leukocytes, UA: NEGATIVE
Nitrite, UA: NEGATIVE
Protein Ur, POC: NEGATIVE mg/dL
Specific Gravity, Urine: 1.005
Urobilinogen, Ur: 0.2
pH, UA: 6 (ref 5.0–8.0)

## 2021-02-11 NOTE — Progress Notes (Signed)
   Subjective:    Patient ID: Kathy Freeman, female    DOB: Mar 27, 1941, 80 y.o.   MRN: [id number]  HPI She is here for recheck on her back pain.  She now states that the pain is mainly on the right-hand side and is worse with motion in any direction.  No numbness, tingling or weakness going down her legs.   Review of Systems     Objective:   Physical Exam Alert and splinting of her back is noted.  Tender to palpation in the paravertebral muscles.  Normal hip motion.  Negative straight leg raising. Her urine dipstick is negative. Recent blood work was reviewed with her.     Assessment & Plan:  Acute right-sided low back pain without sciatica - Plan: DG Lumbar Spine Complete  Lower abdominal pain - Plan: POCT Urinalysis DIP (Proadvantage Device) Her pain is really not lower abdominal but she told my CMA that.  Her pain is in the right lumbar area. Is now much more localized whereas in the past she complained of muscle spasms and now it is right lumbar area.

## 2021-02-14 ENCOUNTER — Other Ambulatory Visit: Payer: Self-pay

## 2021-02-14 DIAGNOSIS — R1030 Lower abdominal pain, unspecified: Secondary | ICD-10-CM

## 2021-02-19 ENCOUNTER — Other Ambulatory Visit: Payer: Self-pay | Admitting: Family Medicine

## 2021-02-19 DIAGNOSIS — E038 Other specified hypothyroidism: Secondary | ICD-10-CM

## 2021-02-21 ENCOUNTER — Other Ambulatory Visit: Payer: Self-pay

## 2021-02-21 ENCOUNTER — Ambulatory Visit: Payer: Medicare Other | Attending: Family Medicine

## 2021-02-21 DIAGNOSIS — R269 Unspecified abnormalities of gait and mobility: Secondary | ICD-10-CM | POA: Diagnosis not present

## 2021-02-21 DIAGNOSIS — M6281 Muscle weakness (generalized): Secondary | ICD-10-CM | POA: Insufficient documentation

## 2021-02-21 DIAGNOSIS — Z7409 Other reduced mobility: Secondary | ICD-10-CM | POA: Diagnosis not present

## 2021-02-22 NOTE — Therapy (Addendum)
[facility] [address] [place], [place], [address] Phone: [phone]   Fax:  [phone]  Physical Therapy Evaluation  Patient Details  Name: Kathy Freeman MRN: [id number] Date of Birth: 04/16/1941 Referring Provider (PT): Ronnald Nian, MD   Encounter Date: 02/21/2021   PT End of Session - 02/21/21 1754    Visit Number 1    Number of Visits 9    Authorization Type UHC MCR    PT Start Time 1530    PT Stop Time 1615    PT Time Calculation (min) 45 min    Activity Tolerance Patient tolerated treatment well;No increased pain    Behavior During Therapy WFL for tasks assessed/performed           Past Medical History:  Diagnosis Date  . Allergy    RHINITIS  . Arthritis   . Colonic polyp   . Hemorrhoids   . Obesity   . Psoriasis   . Raynaud disease   . Thyroid disease    HYPOTHYROID    Past Surgical History:  Procedure Laterality Date  . ABDOMINAL HYSTERECTOMY  1985  . CHOLECYSTECTOMY    . Left ear      There were no vitals filed for this visit.    Subjective Assessment - 02/21/21 1533    Subjective Pt is a pleasant 80 y/o F who presents to PT with reports of lower back and bilateral LE pain and discomfort. 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 She states she loves to walk and is frustrated by her decreased walking distance without onset of LBP. Pt works at the [employer] and has trouble ambulating short and longer distances without sharp increases in pain. Denies bowel/bladder changes or saddle anesthesia. Pt notes more pain in her LEs when walking longer distances, denies paresthesias. She also notes symptoms are slightly less when leaning on shopping cart when shopping.    Limitations Walking;Standing    How long can you sit comfortably? 15-20 min    How long can you stand comfortably? 20 min    How long can you walk comfortably? "I can  only walk 28ft comfortably"    Patient Stated Goals pt would like to decrease pain to get back to gardening, walking, "be outdoors all I can be"    Currently in Pain? Yes    Pain Score 4     Pain Location Back    Pain Orientation Lower    Pain Descriptors / Indicators Aching    Pain Type Chronic pain    Pain Radiating Towards bilateral LEs    Aggravating Factors  prolonged walking, lifting, squating - 8/10 at worst    Pain Relieving Factors Rest, biofreeze, medication              [facility] PT Assessment - 02/22/21 0001      Assessment   Medical Diagnosis R10.30 (ICD-10-CM) - Lower abdominal pain    Referring Provider (PT) Ronnald Nian, MD    Hand Dominance Right      Precautions   Precautions None      Balance Screen   Has the patient fallen in the past 6 months No    Has the patient had a decrease in activity level because of a fear of falling?  No    Is the patient reluctant to leave their home because of a fear of falling?  No  Home Environment   Living Environment Private residence    Living Arrangements Spouse/significant other    Type of Home Mobile home    Home Access Stairs to enter    Entrance Stairs-Number of Steps 5    Home Layout One level    Home Equipment [place] - single point      Prior Function   Level of Independence Independent    Vocation Other (comment);Part time employment   works at [employer], standing      Observation/Other Assessments   Focus on Therapeutic Outcomes (FOTO)  51%      Sensation   Light Touch Appears Intact      AROM   Lumbar Flexion 70    Lumbar Extension 20      Strength   Right Hip Flexion 3+/5    Right Hip ABduction 3+/5    Right Hip ADduction 3+/5    Left Hip Flexion 3+/5    Left Hip ABduction 3+/5    Left Hip ADduction 3+/5    Right Knee Flexion 4/5    Right Knee Extension 4+/5    Left Knee Flexion 3+/5    Left Knee Extension 4+/5    Right Ankle Dorsiflexion 5/5     Right Ankle Plantar Flexion 5/5    Left Ankle Dorsiflexion 5/5    Left Ankle Plantar Flexion 5/5      Transfers   Five time sit to stand comments  12 sec                      Objective measurements completed on examination: See above findings.               PT Education - 02/22/21 0849    Education Details education on current condition, HEP, and POC    Person(s) Educated Patient    Methods Explanation;Demonstration;Handout    Comprehension Verbalized understanding;Returned demonstration            PT Short Term Goals - 02/22/21 0854      PT SHORT TERM GOAL #1   Title Pt will be knowledgeable with 90% of HEP in order to improve carryover between treatment sessions    Baseline initial HEP given    Time 3    Period Weeks    Status New    Target Date 03/15/21      PT SHORT TERM GOAL #2   Title Pt will self report LBP no greater than 6/10 at worst in order to improve comfort and functional ability    Baseline 8/10    Time 3    Period Weeks    Status New    Target Date 03/15/21             PT Long Term Goals - 02/22/21 0857      PT LONG TERM GOAL #1   Title Pt will improve FOTO score to no less than 58% in order to improve confidence and functional ability    Baseline 51%    Time 8    Period Weeks    Status New    Target Date 04/19/21      PT LONG TERM GOAL #2   Title Pt will increase standing tolerance to at least 40 minutes in order to get back to gardening and other desired recreational activities with improved comfort    Baseline 20 minutes    Time 8    Period Weeks  Status New    Target Date 04/19/21      PT LONG TERM GOAL #3   Title Pt will increase all LE MMT to no less than 4+/5 in order to improve functional activity tolerance and mobility    Baseline see flowsheet    Time 8    Period Weeks    Status New    Target Date 04/19/21                  Plan - 02/22/21 0847    Clinical Impression Statement Pt is  a pleasant 80 y/o F who presents to PT with reports of lower back and bilateral LE pain and discomfort. Physcial findings are consistent with MD impression of lower back pain. Due to patient symptoms and subjective reports, cannont rule out the possibility of lumbar stenosis secondary to 4/5 positive factors on examination stenosis cluster. Pt demonstrates significant LE weakness, although her 5xSTS score does place her out of fall risk category. Pt would benefit from skilled PT services working on increasing core and proximal hip muscle strength in order to decrease pain and improve functional ability.    Personal Factors and Comorbidities Age;Fitness    Examination-Activity Limitations Squat;Stairs;Stand;Carry    Examination-Participation Restrictions Shop;Yard Work;Community Activity;Occupation    Stability/Clinical Decision Making Stable/Uncomplicated    Clinical Decision Making Low    Rehab Potential Good    PT Frequency 1x / week    PT Duration 8 weeks    PT Treatment/Interventions ADLs/Self Care Home Management; Electrical Stimulation; Moist Heat; Traction; Gait training; Stair training; Functional mobility training; Therapeutic activities; Therapeutic exercise; Balance training; Neuromuscular re-education; Patient/family education; Manual techniques; Passive range of motion; Dry needling; Spinal Manipulations; Joint Manipulations   PT Next Visit Plan assess response to initial HEP and progress as able    PT Home Exercise Plan [email or web address]    Consulted and Agree with Plan of Care Patient           Patient will benefit from skilled therapeutic intervention in order to improve the following deficits and impairments:  Abnormal gait,Decreased activity tolerance,Decreased endurance,Decreased range of motion,Decreased strength,Difficulty walking,Pain  Visit Diagnosis: Muscle weakness (generalized) - Plan: PT plan of care cert/re-cert  Impaired gait - Plan: PT plan of care cert/re-cert  Impaired  functional mobility, balance, and endurance - Plan: PT plan of care cert/re-cert     Problem List Patient Active Problem List   Diagnosis Date Noted  . Polyneuropathy 02/01/2021  . History of mastoiditis 09/16/2018  . Hyperlipidemia LDL goal <130 06/23/2015  . Arthritis 09/13/2011  . Hypothyroidism 05/25/2011  . Allergic rhinitis due to pollen 05/25/2011  . Psoriasis 05/25/2011    Eloy End 02/22/2021, 9:07 AM  [facility] [address] [place], [place], [address] Phone: [phone]   Fax:  [phone]  Name: KEILEY LEVEY MRN: [id number] Date of Birth: [birth year]

## 2021-03-07 ENCOUNTER — Ambulatory Visit: Payer: Medicare Other | Admitting: Physical Therapy

## 2021-03-07 ENCOUNTER — Other Ambulatory Visit: Payer: Self-pay

## 2021-03-07 ENCOUNTER — Encounter: Payer: Self-pay | Admitting: Physical Therapy

## 2021-03-07 DIAGNOSIS — R269 Unspecified abnormalities of gait and mobility: Secondary | ICD-10-CM

## 2021-03-07 DIAGNOSIS — M6281 Muscle weakness (generalized): Secondary | ICD-10-CM | POA: Diagnosis not present

## 2021-03-07 DIAGNOSIS — Z7409 Other reduced mobility: Secondary | ICD-10-CM

## 2021-03-07 NOTE — Patient Instructions (Signed)
Access Code: [email or web address] URL: [email or web address] Date: 03/07/2021 Prepared by: Jeri Cos  Exercises Sit to Stand Without Arm Support - 1 x daily - 7 x weekly - 3 sets - 10 reps Seated Hamstring Stretch - 1 x daily - 7 x weekly - 3 sets - 3 reps - 30 hold Seated Hip Adduction Squeeze with Ball - 1 x daily - 7 x weekly - 3 sets - 10 reps - 5 hold Standing Knee Flexion AROM with Chair Support - 1 x daily - 7 x weekly - 3 sets - 10 reps Clamshell with Resistance - 1 x daily - 7 x weekly - 3 sets - 10 reps Seated Long Arc Quad - 1 x daily - 7 x weekly - 3 sets - 10 reps

## 2021-03-07 NOTE — Therapy (Addendum)
[facility] [address] [place], [place], [address] Phone: [phone]   Fax:  [phone]  Physical Therapy Treatment  Patient Details  Name: Kathy Freeman MRN: [id number] Date of Birth: 1941-07-14 Referring Provider (PT): Ronnald Nian, MD   Encounter Date: 03/07/2021   PT End of Session - 03/07/21 1710     Visit Number 2    Number of Visits 9    Date for PT Re-Evaluation 04/19/21    Authorization Type UHC MCR    PT Start Time 1620    PT Stop Time 1706    PT Time Calculation (min) 46 min    Activity Tolerance Patient tolerated treatment well;No increased pain    Behavior During Therapy WFL for tasks assessed/performed             Past Medical History:  Diagnosis Date   Allergy    RHINITIS   Arthritis    Colonic polyp    Hemorrhoids    Obesity    Psoriasis    Raynaud disease    Thyroid disease    HYPOTHYROID    Past Surgical History:  Procedure Laterality Date   ABDOMINAL HYSTERECTOMY  1985   CHOLECYSTECTOMY     Left ear      There were no vitals filed for this visit.   Subjective Assessment - 03/07/21 1620     Subjective No problems with any of the exercises. Back is about the same.    Currently in Pain? Yes    Pain Score 5     Pain Location Back    Pain Orientation Lower    Pain Descriptors / Indicators Cramping    Pain Type Chronic pain                                               [facility] Adult PT Treatment/Exercise - 03/07/21 0001       Exercises   Exercises Lumbar      Lumbar Exercises: Stretches   Passive Hamstring Stretch Left;Right;2 reps;30 seconds    Lower Trunk Rotation 5 reps;10 seconds    Lower Trunk Rotation Limitations felt in legs, not back      Lumbar Exercises: Aerobic   Recumbent Bike L3 x 5 min UE/LE      Lumbar Exercises: Standing   Other Standing Lumbar Exercises hip abduction 2x10; hip extension 2x10   momentum noted, cued to decrease  momentum with leg exercises; decreased hip extension noted and compensation in lumbar extension     Lumbar Exercises: Seated   Long Arc Quad on Chair 2 sets;10 reps;Both    Sit to Stand 10 reps   2 sets   Sit to Stand Limitations one without 2" step, one with   noted trunk rocking for momentum to get up, used 2" step to decrease this   Other Seated Lumbar Exercises isometric hip adduction 10" x10      Lumbar Exercises: Supine   Clam 10 reps    Clam Limitations red band    Bridge 5 reps    Bridge Limitations spasming in the legs noted, d/c      Lumbar Exercises: Sidelying   Clam 10 reps   2 sets   Clam Limitations red band   noted trunk rotation despite multimodal cueing and demonstration  PT Education - 03/07/21 1738     Education Details HEP update    Person(s) Educated Patient    Methods Explanation;Demonstration;Handout;Tactile cues;Verbal cues    Comprehension Verbalized understanding;Returned demonstration;Need further instruction              PT Short Term Goals - 02/22/21 0854       PT SHORT TERM GOAL #1   Title Pt will be knowledgeable with 90% of HEP in order to improve carryover between treatment sessions    Baseline initial HEP given    Time 3    Period Weeks    Status New    Target Date 03/15/21      PT SHORT TERM GOAL #2   Title Pt will self report LBP no greater than 6/10 at worst in order to improve comfort and functional ability    Baseline 8/10    Time 3    Period Weeks    Status New    Target Date 03/15/21                PT Long Term Goals - 02/22/21 0857       PT LONG TERM GOAL #1   Title Pt will improve FOTO score to no less than 58% in order to improve confidence and functional ability    Baseline 51%    Time 8    Period Weeks    Status New    Target Date 04/19/21      PT LONG TERM GOAL #2   Title Pt will increase standing tolerance to at least 40 minutes in order to get back to gardening and  other desired recreational activities with improved comfort    Baseline 20 minutes    Time 8    Period Weeks    Status New    Target Date 04/19/21      PT LONG TERM GOAL #3   Title Pt will increase all LE MMT to no less than 4+/5 in order to improve functional activity tolerance and mobility    Baseline see flowsheet    Time 8    Period Weeks    Status New    Target Date 04/19/21                        Plan - 03/07/21 1738     Clinical Impression Statement Pt tolerated tx well with no adverse effects. Pt noted using momentum with LE and needed cueing with most exercises to slow down and not use body momentum/focus on slow concentric/eccentric movmement. Cueing also needed throughout for proper form during exercises. Exercises in sidelying, supine, seated, and standing done this session focusing on hip strengthening. Possible hip flexor tightness noted in standing as she is unable to extend hip past neutral. Pt would continue to benefit from skilled PT in order to address LE and core strength deficits to increase walking and prolonged standing/sitting ability to help increase her walking for exercise and help with job duties.    PT Next Visit Plan progress core/LE strengthening exercises    PT Home Exercise Plan [email or web address]    Consulted and Agree with Plan of Care Patient             Patient will benefit from skilled therapeutic intervention in order to improve the following deficits and impairments:  Abnormal gait,Decreased activity tolerance,Decreased endurance,Decreased range of motion,Decreased strength,Difficulty walking,Pain  Visit Diagnosis: Muscle weakness (generalized)  Impaired gait  Impaired functional mobility,  balance, and endurance      Problem List Patient Active Problem List   Diagnosis Date Noted   Polyneuropathy 02/01/2021   History of mastoiditis 09/16/2018   Hyperlipidemia LDL goal <130 06/23/2015   Arthritis 09/13/2011   Hypothyroidism  05/25/2011   Allergic rhinitis due to pollen 05/25/2011   Psoriasis 05/25/2011    Jeri Cos, SPT 03/07/2021, 5:45 PM  [facility] [address] [place], [place], [address] Phone: [phone]   Fax:  [phone]  Name: Kathy Freeman MRN: [id number] Date of Birth: [birth year]

## 2021-03-08 NOTE — Addendum Note (Signed)
Addended by: Eloy End on: 03/08/2021 12:25 PM   Modules accepted: Orders

## 2021-03-14 ENCOUNTER — Other Ambulatory Visit: Payer: Self-pay

## 2021-03-14 ENCOUNTER — Ambulatory Visit: Payer: Medicare Other | Attending: Family Medicine

## 2021-03-14 DIAGNOSIS — M6281 Muscle weakness (generalized): Secondary | ICD-10-CM | POA: Diagnosis not present

## 2021-03-14 DIAGNOSIS — Z7409 Other reduced mobility: Secondary | ICD-10-CM | POA: Diagnosis not present

## 2021-03-14 DIAGNOSIS — R269 Unspecified abnormalities of gait and mobility: Secondary | ICD-10-CM | POA: Diagnosis not present

## 2021-03-14 NOTE — Therapy (Signed)
[facility] [address] [place], [place], [address] Phone: [phone]   Fax:  [phone]  Physical Therapy Treatment  Patient Details  Name: Kathy Freeman MRN: [id number] Date of Birth: [birth year] Referring Provider (PT): Ronnald Nian, MD   Encounter Date: 03/14/2021   PT End of Session - 03/14/21 1532    Visit Number 3    Number of Visits 9    Date for PT Re-Evaluation 04/19/21    Authorization Type UHC MCR    PT Start Time 1532    PT Stop Time 1613    PT Time Calculation (min) 41 min    Activity Tolerance Patient tolerated treatment well;No increased pain    Behavior During Therapy WFL for tasks assessed/performed           Past Medical History:  Diagnosis Date  . Allergy    RHINITIS  . Arthritis   . Colonic polyp   . Hemorrhoids   . Obesity   . Psoriasis   . Raynaud disease   . Thyroid disease    HYPOTHYROID    Past Surgical History:  Procedure Laterality Date  . ABDOMINAL HYSTERECTOMY  1985  . CHOLECYSTECTOMY    . Left ear      There were no vitals filed for this visit.   Subjective Assessment - 03/14/21 1533    Subjective Pt presents to PT with no reports of pain. She has been compliant with HEP with no adverse effect. PT is ready to begin PT at this time.    Currently in Pain? No/denies    Pain Score 0-No pain                             [facility] Adult PT Treatment/Exercise - 03/14/21 0001      Exercises   Exercises Knee/Hip      Lumbar Exercises: Supine   Clam 15 reps;2 seconds   x 2   Clam Limitations blue tband    Straight Leg Raise 10 reps    Other Supine Lumbar Exercises supine ball squeeze 2x10 - 5 sec hold      Knee/Hip Exercises: Standing   Hip Abduction 10 reps;Both    Abduction Limitations yellow tband    Hip Extension 10 reps;Both    Extension Limitations yellow tband    Other Standing Knee Exercises lateral walk yellow tband x 3 laps in // bars       Knee/Hip Exercises: Seated   Long Arc Quad 2 sets;10 reps;Both    Long Arc Quad Weight 3 lbs.    Hamstring Curl 2 sets;10 reps;Limitations    Hamstring Limitations blue tband    Sit to Sand 2 sets;10 reps;without UE support                    PT Short Term Goals - 02/22/21 0854      PT SHORT TERM GOAL #1   Title Pt will be knowledgeable with 90% of HEP in order to improve carryover between treatment sessions    Baseline initial HEP given    Time 3    Period Weeks    Status New    Target Date 03/15/21      PT SHORT TERM GOAL #2   Title Pt will self report LBP no greater than 6/10 at worst in order to improve comfort and functional ability    Baseline 8/10    Time 3  Period Weeks    Status New    Target Date 03/15/21             PT Long Term Goals - 02/22/21 0857      PT LONG TERM GOAL #1   Title Pt will improve FOTO score to no less than 58% in order to improve confidence and functional ability    Baseline 51%    Time 8    Period Weeks    Status New    Target Date 04/19/21      PT LONG TERM GOAL #2   Title Pt will increase standing tolerance to at least 40 minutes in order to get back to gardening and other desired recreational activities with improved comfort    Baseline 20 minutes    Time 8    Period Weeks    Status New    Target Date 04/19/21      PT LONG TERM GOAL #3   Title Pt will increase all LE MMT to no less than 4+/5 in order to improve functional activity tolerance and mobility    Baseline see flowsheet    Time 8    Period Weeks    Status New    Target Date 04/19/21                 Plan - 03/14/21 1603    Clinical Impression Statement Pt was once again able to complete prescribed exercises with no adverse effect or increase in pain. She continues to fatigue quickly with proximal hip strengthening exercises, along with occasional hamstring cramps in hooklying position. Pt is responding well to therapy, with decreased reports of  pain and subjective reports of improved walking. PT will continue to progress pt as tolerated per POC as prescribed.    PT Treatment/Interventions ADLs/Self Care Home Management;Electrical Stimulation;Moist Heat;Traction;Gait training;Stair training;Functional mobility training;Therapeutic activities;Therapeutic exercise;Balance training;Neuromuscular re-education;Patient/family education;Manual techniques;Passive range of motion;Dry needling;Spinal Manipulations;Joint Manipulations    PT Next Visit Plan progress core/LE strengthening exercises    PT Home Exercise Plan [email or web address]    Consulted and Agree with Plan of Care Patient           Patient will benefit from skilled therapeutic intervention in order to improve the following deficits and impairments:  Abnormal gait,Decreased activity tolerance,Decreased endurance,Decreased range of motion,Decreased strength,Difficulty walking,Pain  Visit Diagnosis: Muscle weakness (generalized)  Impaired gait  Impaired functional mobility, balance, and endurance     Problem List Patient Active Problem List   Diagnosis Date Noted  . Polyneuropathy 02/01/2021  . History of mastoiditis 09/16/2018  . Hyperlipidemia LDL goal <130 06/23/2015  . Arthritis 09/13/2011  . Hypothyroidism 05/25/2011  . Allergic rhinitis due to pollen 05/25/2011  . Psoriasis 05/25/2011    Eloy End, PT, DPT 03/14/21 4:17 PM  [facility] [address] [place], [place], [address] Phone: [phone]   Fax:  [phone]  Name: Kathy Freeman MRN: [id number] Date of Birth: 01-Oct-1941

## 2021-03-21 ENCOUNTER — Ambulatory Visit: Payer: Medicare Other

## 2021-03-21 ENCOUNTER — Other Ambulatory Visit: Payer: Self-pay

## 2021-03-21 DIAGNOSIS — R269 Unspecified abnormalities of gait and mobility: Secondary | ICD-10-CM | POA: Diagnosis not present

## 2021-03-21 DIAGNOSIS — Z7409 Other reduced mobility: Secondary | ICD-10-CM

## 2021-03-21 DIAGNOSIS — M6281 Muscle weakness (generalized): Secondary | ICD-10-CM | POA: Diagnosis not present

## 2021-03-21 NOTE — Therapy (Signed)
[facility] [address] [place], [place], [address] Phone: [phone]   Fax:  [phone]  Physical Therapy Treatment  Patient Details  Name: Kathy Freeman MRN: [id number] Date of Birth: 09/03/1941 Referring Provider (PT): Ronnald Nian, MD   Encounter Date: 03/21/2021   PT End of Session - 03/21/21 1515    Visit Number 4    Number of Visits 9    Date for PT Re-Evaluation 04/19/21    Authorization Type UHC MCR    PT Start Time 1517    PT Stop Time 1558    PT Time Calculation (min) 41 min    Activity Tolerance Patient tolerated treatment well;No increased pain    Behavior During Therapy WFL for tasks assessed/performed           Past Medical History:  Diagnosis Date  . Allergy    RHINITIS  . Arthritis   . Colonic polyp   . Hemorrhoids   . Obesity   . Psoriasis   . Raynaud disease   . Thyroid disease    HYPOTHYROID    Past Surgical History:  Procedure Laterality Date  . ABDOMINAL HYSTERECTOMY  1985  . CHOLECYSTECTOMY    . Left ear      There were no vitals filed for this visit.   Subjective Assessment - 03/21/21 1516    Subjective Pt presents to PT with no current reports of pain. She has continued to be compliant with her HEP with no adverse effects noted. Pt is ready to begin PT treatment at this time.    Currently in Pain? No/denies    Pain Score 0-No pain                             [facility] Adult PT Treatment/Exercise - 03/21/21 0001      Knee/Hip Exercises: Aerobic   Nustep lvl 5 UE/LE x 5 min while taking subjective      Knee/Hip Exercises: Standing   Hip Abduction 2 sets;10 reps;Both    Abduction Limitations red tband    Hip Extension 2 sets;10 reps;Both    Extension Limitations red tband      Knee/Hip Exercises: Seated   Sit to Sand 2 sets;10 reps;without UE support   holding 5lb DB     Knee/Hip Exercises: Supine   Hip Adduction Isometric 2 sets;10 reps    Hip  Adduction Isometric Limitations 5 sec hold    Straight Leg Raises 2 sets;10 reps;Both    Other Supine Knee/Hip Exercises Clam 3x15 black tband                    PT Short Term Goals - 03/21/21 1603      PT SHORT TERM GOAL #1   Title Pt will be knowledgeable with 90% of HEP in order to improve carryover between treatment sessions    Baseline initial HEP given    Time 3    Period Weeks    Status Achieved    Target Date 03/15/21      PT SHORT TERM GOAL #2   Title Pt will self report LBP no greater than 6/10 at worst in order to improve comfort and functional ability    Baseline 8/10    Time 3    Period Weeks    Status Achieved    Target Date 03/15/21             PT  Long Term Goals - 02/22/21 0857      PT LONG TERM GOAL #1   Title Pt will improve FOTO score to no less than 58% in order to improve confidence and functional ability    Baseline 51%    Time 8    Period Weeks    Status New    Target Date 04/19/21      PT LONG TERM GOAL #2   Title Pt will increase standing tolerance to at least 40 minutes in order to get back to gardening and other desired recreational activities with improved comfort    Baseline 20 minutes    Time 8    Period Weeks    Status New    Target Date 04/19/21      PT LONG TERM GOAL #3   Title Pt will increase all LE MMT to no less than 4+/5 in order to improve functional activity tolerance and mobility    Baseline see flowsheet    Time 8    Period Weeks    Status New    Target Date 04/19/21                 Plan - 03/21/21 1553    Clinical Impression Statement Pt was able to progress standing exercises with no adverse effect or increase in pain. She continues to progress well with therapy, with subjective improvement of increased ambulatory distance and trunk extension. Pt has also had decrease in LBP symptom frequency and has been able to progress difficulty of exercises. PT will continue to progress exercises as able per  POC as prescribed.    PT Treatment/Interventions ADLs/Self Care Home Management;Electrical Stimulation;Moist Heat;Traction;Gait training;Stair training;Functional mobility training;Therapeutic activities;Therapeutic exercise;Balance training;Neuromuscular re-education;Patient/family education;Manual techniques;Passive range of motion;Dry needling;Spinal Manipulations;Joint Manipulations    PT Next Visit Plan progress core/LE strengthening exercises - progress standing exercises    PT Home Exercise Plan [email or web address]    Consulted and Agree with Plan of Care Patient           Patient will benefit from skilled therapeutic intervention in order to improve the following deficits and impairments:  Abnormal gait,Decreased activity tolerance,Decreased endurance,Decreased range of motion,Decreased strength,Difficulty walking,Pain  Visit Diagnosis: Muscle weakness (generalized)  Impaired gait  Impaired functional mobility, balance, and endurance     Problem List Patient Active Problem List   Diagnosis Date Noted  . Polyneuropathy 02/01/2021  . History of mastoiditis 09/16/2018  . Hyperlipidemia LDL goal <130 06/23/2015  . Arthritis 09/13/2011  . Hypothyroidism 05/25/2011  . Allergic rhinitis due to pollen 05/25/2011  . Psoriasis 05/25/2011    Eloy End, PT, DPT 03/21/21 4:04 PM  [facility] [address] [place], [place], [address] Phone: [phone]   Fax:  [phone]  Name: Kathy Freeman MRN: [id number] Date of Birth: 11-Jun-1941

## 2021-03-28 ENCOUNTER — Ambulatory Visit: Payer: Medicare Other | Admitting: Physical Therapy

## 2021-03-28 ENCOUNTER — Other Ambulatory Visit: Payer: Self-pay

## 2021-03-28 ENCOUNTER — Encounter: Payer: Self-pay | Admitting: Physical Therapy

## 2021-03-28 DIAGNOSIS — R269 Unspecified abnormalities of gait and mobility: Secondary | ICD-10-CM

## 2021-03-28 DIAGNOSIS — Z7409 Other reduced mobility: Secondary | ICD-10-CM

## 2021-03-28 DIAGNOSIS — M6281 Muscle weakness (generalized): Secondary | ICD-10-CM

## 2021-03-28 NOTE — Patient Instructions (Addendum)
Access Code: [email or web address] URL: [email or web address] Date: 03/28/2021 Prepared by: Rosana Hoes  Exercises Seated Hamstring Stretch - 1 x daily - 5 x weekly - 3 reps - 30 hold Sit to Stand Without Arm Support - 1 x daily - 5 x weekly - 3 sets - 10 reps Seated Long Arc Quad - 1 x daily - 5 x weekly - 2 sets - 20 reps Standing Knee Flexion AROM with Chair Support - 1 x daily - 5 x weekly - 2 sets - 10 reps Standing Hip Abduction with Resistance at Ankles and Counter Support - 1 x daily - 5 x weekly - 2 sets - 10 reps Standing Hip Extension with Resistance at Ankles and Counter Support - 1 x daily - 5 x weekly - 2 sets - 10 reps Heel rises with counter support - 1 x daily - 5 x weekly - 2 sets - 15 reps Standing Marching - 1 x daily - 5 x weekly - 2 sets - 20 reps

## 2021-03-28 NOTE — Therapy (Signed)
[facility] [address] [place], [place], [address] Phone: [phone]   Fax:  [phone]  Physical Therapy Treatment  Patient Details  Name: Kathy Freeman MRN: [id number] Date of Birth: Dec 30, 1940 Referring Provider (PT): Ronnald Nian, MD   Encounter Date: 03/28/2021   PT End of Session - 03/28/21 1535    Visit Number 5    Number of Visits 9    Date for PT Re-Evaluation 04/19/21    Authorization Type UHC MCR    Progress Note Due on Visit 10    PT Start Time 1530    PT Stop Time 1613    PT Time Calculation (min) 43 min    Activity Tolerance Patient tolerated treatment well    Behavior During Therapy [facility] for tasks assessed/performed           Past Medical History:  Diagnosis Date  . Allergy    RHINITIS  . Arthritis   . Colonic polyp   . Hemorrhoids   . Obesity   . Psoriasis   . Raynaud disease   . Thyroid disease    HYPOTHYROID    Past Surgical History:  Procedure Laterality Date  . ABDOMINAL HYSTERECTOMY  1985  . CHOLECYSTECTOMY    . Left ear      There were no vitals filed for this visit.   Subjective Assessment - 03/28/21 1533    Subjective Patient reports she is doing a whole lot better, she is walking better and standing up straighter. Reports she walks all the time, still working at the [employer] on weekends.    Patient Stated Goals pt would like to decrease pain to get back to gardening, walking, "be outdoors all I can be"    Currently in Pain? No/denies              [facility] PT Assessment - 03/28/21 0001      Strength   Right Knee Flexion 4+/5    Right Knee Extension 4+/5    Left Knee Flexion 4+/5    Left Knee Extension 4+/5                         [facility] Adult PT Treatment/Exercise - 03/28/21 0001      Exercises   Exercises Knee/Hip      Knee/Hip Exercises: Aerobic   Nustep L5 UE/LE x 5 min while taking subjective      Knee/Hip Exercises: Standing   Heel Raises 2  sets;15 reps    Knee Flexion 2 sets;10 reps    Knee Flexion Limitations 2#    Hip Flexion 2 sets;20 reps    Hip Abduction 2 sets;10 reps    Abduction Limitations green band    Hip Extension 2 sets;10 reps    Extension Limitations green band      Knee/Hip Exercises: Seated   Long Arc Quad 2 sets;15 reps    Long Arc Quad Weight 3 lbs.    Sit to Sand 3 sets;10 reps   2nd set with 5#, 3rd set with 10#                 PT Education - 03/28/21 1534    Education Details HEP update    Person(s) Educated Patient    Methods Explanation;Demonstration;Verbal cues;Handout    Comprehension Verbalized understanding;Need further instruction;Returned demonstration;Verbal cues required            PT Short Term Goals - 03/21/21 1603  PT SHORT TERM GOAL #1   Title Pt will be knowledgeable with 90% of HEP in order to improve carryover between treatment sessions    Baseline initial HEP given    Time 3    Period Weeks    Status Achieved    Target Date 03/15/21      PT SHORT TERM GOAL #2   Title Pt will self report LBP no greater than 6/10 at worst in order to improve comfort and functional ability    Baseline 8/10    Time 3    Period Weeks    Status Achieved    Target Date 03/15/21             PT Long Term Goals - 02/22/21 0857      PT LONG TERM GOAL #1   Title Pt will improve FOTO score to no less than 58% in order to improve confidence and functional ability    Baseline 51%    Time 8    Period Weeks    Status New    Target Date 04/19/21      PT LONG TERM GOAL #2   Title Pt will increase standing tolerance to at least 40 minutes in order to get back to gardening and other desired recreational activities with improved comfort    Baseline 20 minutes    Time 8    Period Weeks    Status New    Target Date 04/19/21      PT LONG TERM GOAL #3   Title Pt will increase all LE MMT to no less than 4+/5 in order to improve functional activity tolerance and mobility     Baseline see flowsheet    Time 8    Period Weeks    Status New    Target Date 04/19/21                 Plan - 03/28/21 1536    Clinical Impression Statement Patent tolerated therapy well with no adverse effects. Therapy conitnued to focus on core and general LE strengthening to improve walking and standing tolerance. Patient able to progress with resistance for sit<>stand and other standing strengthening. No pain reported with therapy, patient did note general LE fatigue with strengthening. Patient would benefit from continued skilled PT in order to progress LE and core strength deficits to increase walking and prolonged standing/sitting ability, and help with job duties.    PT Treatment/Interventions ADLs/Self Care Home Management;Electrical Stimulation;Moist Heat;Traction;Gait training;Stair training;Functional mobility training;Therapeutic activities;Therapeutic exercise;Balance training;Neuromuscular re-education;Patient/family education;Manual techniques;Passive range of motion;Dry needling;Spinal Manipulations;Joint Manipulations    PT Next Visit Plan Assess FOTO, progress core/LE strengthening exercises - progress standing exercises    PT Home Exercise Plan [email or web address]    Consulted and Agree with Plan of Care Patient           Patient will benefit from skilled therapeutic intervention in order to improve the following deficits and impairments:  Abnormal gait,Decreased activity tolerance,Decreased endurance,Decreased range of motion,Decreased strength,Difficulty walking,Pain  Visit Diagnosis: Muscle weakness (generalized)  Impaired gait  Impaired functional mobility, balance, and endurance     Problem List Patient Active Problem List   Diagnosis Date Noted  . Polyneuropathy 02/01/2021  . History of mastoiditis 09/16/2018  . Hyperlipidemia LDL goal <130 06/23/2015  . Arthritis 09/13/2011  . Hypothyroidism 05/25/2011  . Allergic rhinitis due to pollen 05/25/2011  .  Psoriasis 05/25/2011    Rosana Hoes, PT, DPT, LAT, ATC 03/28/21  4:14 PM Phone: [phone] Fax:  [phone]   [facility] Out[facility] [address] [place], [place], [address] Phone: [phone]   Fax:  [phone]  Name: Kathy Freeman MRN: [id number] Date of Birth: 1940-12-23

## 2021-04-04 ENCOUNTER — Encounter: Payer: Self-pay | Admitting: Physical Therapy

## 2021-04-04 ENCOUNTER — Other Ambulatory Visit: Payer: Self-pay

## 2021-04-04 ENCOUNTER — Ambulatory Visit: Payer: Medicare Other | Admitting: Physical Therapy

## 2021-04-04 DIAGNOSIS — Z7409 Other reduced mobility: Secondary | ICD-10-CM

## 2021-04-04 DIAGNOSIS — M6281 Muscle weakness (generalized): Secondary | ICD-10-CM

## 2021-04-04 DIAGNOSIS — R269 Unspecified abnormalities of gait and mobility: Secondary | ICD-10-CM | POA: Diagnosis not present

## 2021-04-04 NOTE — Therapy (Signed)
[facility] [address] [place], [place], [address] Phone: [phone]   Fax:  [phone]  Physical Therapy Treatment  Patient Details  Name: Kathy Freeman MRN: [id number] Date of Birth: [birth year] Referring Provider (PT): Ronnald Nian, MD   Encounter Date: 04/04/2021   PT End of Session - 04/04/21 1501    Visit Number 6    Number of Visits 9    Date for PT Re-Evaluation 04/19/21    Authorization Type UHC MCR    Progress Note Due on Visit 10    PT Start Time 1510    PT Stop Time 1555    PT Time Calculation (min) 45 min    Activity Tolerance Patient tolerated treatment well    Behavior During Therapy [facility] for tasks assessed/performed           Past Medical History:  Diagnosis Date  . Allergy    RHINITIS  . Arthritis   . Colonic polyp   . Hemorrhoids   . Obesity   . Psoriasis   . Raynaud disease   . Thyroid disease    HYPOTHYROID    Past Surgical History:  Procedure Laterality Date  . ABDOMINAL HYSTERECTOMY  1985  . CHOLECYSTECTOMY    . Left ear      There were no vitals filed for this visit.   Subjective Assessment - 04/04/21 1500    Subjective Patient reports she is doing alright, she is feeling a little achy with the rain due to arthritis. She reports she worked for 6 hours this weekend. She reports continues to stand up straighter, she is walking more and without thinking about taking rest breaks.    Patient Stated Goals pt would like to decrease pain to get back to gardening, walking, "be outdoors all I can be"    Currently in Pain? No/denies              [facility] PT Assessment - 04/04/21 0001      Observation/Other Assessments   Focus on Therapeutic Outcomes (FOTO)  56% functional status                         [facility] Adult PT Treatment/Exercise - 04/04/21 0001      Self-Care   Self-Care Other Self-Care Comments    Other Self-Care Comments  FOTO      Neuro Re-ed    Neuro  Re-ed Details  Tandem stance 2 x 30 sec each      Exercises   Exercises Knee/Hip      Knee/Hip Exercises: Aerobic   Nustep L6 x 5 min with UE/LE while taking subjective      Knee/Hip Exercises: Standing   Heel Raises 2 sets;15 reps    Heel Raises Limitations 3#    Hip Flexion 2 sets;20 reps    Hip Flexion Limitations 3#    Hip Abduction 2 sets;10 reps    Abduction Limitations 3#    Hip Extension 2 sets;10 reps    Extension Limitations 3#    Forward Step Up 2 sets;10 reps    Forward Step Up Limitations 8" step height with single UE support      Knee/Hip Exercises: Seated   Long Arc Quad 2 sets;15 reps    Long Arc Quad Weight 3 lbs.    Sit to Sand 3 sets;10 reps   10#  PT Education - 04/04/21 1500    Education Details HEP    Person(s) Educated Patient    Methods Explanation    Comprehension Verbalized understanding            PT Short Term Goals - 03/21/21 1603      PT SHORT TERM GOAL #1   Title Pt will be knowledgeable with 90% of HEP in order to improve carryover between treatment sessions    Baseline initial HEP given    Time 3    Period Weeks    Status Achieved    Target Date 03/15/21      PT SHORT TERM GOAL #2   Title Pt will self report LBP no greater than 6/10 at worst in order to improve comfort and functional ability    Baseline 8/10    Time 3    Period Weeks    Status Achieved    Target Date 03/15/21             PT Long Term Goals - 04/04/21 1547      PT LONG TERM GOAL #1   Title Pt will improve FOTO score to no less than 58% in order to improve confidence and functional ability    Baseline 56% - 04/04/21    Time 8    Period Weeks    Status On-going    Target Date 04/19/21      PT LONG TERM GOAL #2   Title Pt will increase standing tolerance to at least 40 minutes in order to get back to gardening and other desired recreational activities with improved comfort    Baseline 20 minutes    Time 8    Period Weeks     Status New    Target Date 04/19/21      PT LONG TERM GOAL #3   Title Pt will increase all LE MMT to no less than 4+/5 in order to improve functional activity tolerance and mobility    Baseline see flowsheet    Time 8    Period Weeks    Status New    Target Date 04/19/21                 Plan - 04/04/21 1501    Clinical Impression Statement Patent tolerated therapy well with no adverse effects. She reports improvement in functional level on FOTO this visit compared to evaluation. Continued focus on LE strengthening and initiated step-ups to progress strength, patient did require extended rest breaks following each set and exhibited greater difficulty on left. She continues to report improvement in her standing and walking ability at work. Patient would benefit from continued skilled PT in order to progress LE and core strength deficits to increase walking and prolonged standing/sitting ability, and help with job duties.    PT Treatment/Interventions ADLs/Self Care Home Management;Electrical Stimulation;Moist Heat;Traction;Gait training;Stair training;Functional mobility training;Therapeutic activities;Therapeutic exercise;Balance training;Neuromuscular re-education;Patient/family education;Manual techniques;Passive range of motion;Dry needling;Spinal Manipulations;Joint Manipulations    PT Next Visit Plan Reassessment for updated POC, review HEP and progress PRN, progress core/LE strengthening exercises - progress standing exercises    PT Home Exercise Plan [email or web address]    Consulted and Agree with Plan of Care Patient           Patient will benefit from skilled therapeutic intervention in order to improve the following deficits and impairments:  Abnormal gait,Decreased activity tolerance,Decreased endurance,Decreased range of motion,Decreased strength,Difficulty walking,Pain  Visit Diagnosis: Muscle weakness (generalized)  Impaired gait  Impaired functional mobility, balance,  and  endurance     Problem List Patient Active Problem List   Diagnosis Date Noted  . Polyneuropathy 02/01/2021  . History of mastoiditis 09/16/2018  . Hyperlipidemia LDL goal <130 06/23/2015  . Arthritis 09/13/2011  . Hypothyroidism 05/25/2011  . Allergic rhinitis due to pollen 05/25/2011  . Psoriasis 05/25/2011    Rosana Hoes, PT, DPT, LAT, ATC 04/04/21  3:57 PM Phone: [phone] Fax: [phone]   [facility] [address] [place], [place], [address] Phone: [phone]   Fax:  [phone]  Name: Kathy Freeman MRN: [id number] Date of Birth: 1941/02/12

## 2021-04-18 ENCOUNTER — Ambulatory Visit: Payer: Medicare Other | Attending: Family Medicine

## 2021-04-18 ENCOUNTER — Other Ambulatory Visit: Payer: Self-pay

## 2021-04-18 DIAGNOSIS — R269 Unspecified abnormalities of gait and mobility: Secondary | ICD-10-CM | POA: Diagnosis not present

## 2021-04-18 DIAGNOSIS — Z7409 Other reduced mobility: Secondary | ICD-10-CM

## 2021-04-18 DIAGNOSIS — M6281 Muscle weakness (generalized): Secondary | ICD-10-CM | POA: Insufficient documentation

## 2021-04-18 NOTE — Therapy (Addendum)
[place], [place], [address] Phone: [phone]   Fax:  [phone]  Physical Therapy Treatment  Patient Details  Name: Kathy Freeman MRN: [id number] Date of Birth: Jun 07, 1941 Referring Provider (PT): Denita Lung, MD   Encounter Date: 04/18/2021   PT End of Session - 04/18/21 1504    Visit Number 7    Number of Visits 9    Date for PT Re-Evaluation 04/19/21    Authorization Type UHC MCR    Progress Note Due on Visit 10    PT Start Time 1510    PT Stop Time 1550    PT Time Calculation (min) 40 min    Activity Tolerance Patient tolerated treatment well    Behavior During Therapy [facility] for tasks assessed/performed           Past Medical History:  Diagnosis Date  . Allergy    RHINITIS  . Arthritis   . Colonic polyp   . Hemorrhoids   . Obesity   . Psoriasis   . Raynaud disease   . Thyroid disease    HYPOTHYROID    Past Surgical History:  Procedure Laterality Date  . ABDOMINAL HYSTERECTOMY  1985  . CHOLECYSTECTOMY    . Left ear      There were no vitals filed for this visit.   Subjective Assessment - 04/18/21 1505    Subjective Pt presents to PT stating she is doing well. She has been compliant with her HEP with no adverse effect. Pt feels like she is still able to stand up straighter with less discomfort and rest breaks. She is ready to begin PT treatment at this time.    Currently in Pain? No/denies    Pain Score 0-No pain              [facility] PT Assessment - 04/18/21 0001      Observation/Other Assessments   Focus on Therapeutic Outcomes (FOTO)  56% functional status      Strength   Right Hip Flexion 4+/5    Right Hip ABduction 5/5    Right Hip ADduction 5/5    Left Hip Flexion 4+/5    Left Hip ABduction 5/5    Left Hip ADduction 5/5    Right Knee Flexion 5/5    Right Knee Extension 5/5    Left Knee Flexion 5/5    Left Knee Extension 5/5                          [facility] Adult PT Treatment/Exercise - 04/18/21 0001      Knee/Hip Exercises: Aerobic   Nustep L6 x 5 min with UE/LE while taking subjective      Knee/Hip Exercises: Standing   Heel Raises 15 reps    Knee Flexion 15 reps;Both    Hip Flexion 15 reps;Both    Hip Abduction 15 reps;Both    Abduction Limitations green tband    Hip Extension 15 reps;Both    Extension Limitations green tband    Other Standing Knee Exercises tandem stance x 30 sec      Knee/Hip Exercises: Seated   Long Arc Quad 10 reps;Both    Long Arc Quad Limitations blue tband    Sit to [employer] 15 reps;without UE support                  PT Education - 04/18/21 1555    Education Details Final HEP  Person(s) Educated Patient    Methods Explanation;Demonstration;Handout    Comprehension Verbalized understanding;Returned demonstration            PT Short Term Goals - 03/21/21 1603      PT SHORT TERM GOAL #1   Title Pt will be knowledgeable with 90% of HEP in order to improve carryover between treatment sessions    Baseline initial HEP given    Time 3    Period Weeks    Status Achieved    Target Date 03/15/21      PT SHORT TERM GOAL #2   Title Pt will self report LBP no greater than 6/10 at worst in order to improve comfort and functional ability    Baseline 8/10    Time 3    Period Weeks    Status Achieved    Target Date 03/15/21             PT Long Term Goals - 04/18/21 1556      PT LONG TERM GOAL #1   Title Pt will improve FOTO score to no less than 58% in order to improve confidence and functional ability    Baseline 56% - 04/18/21    Time 8    Period Weeks    Status Partially Met      PT LONG TERM GOAL #2   Title Pt will increase standing tolerance to at least 40 minutes in order to get back to gardening and other desired recreational activities with improved comfort    Baseline 20 minutes    Time 8    Period Weeks    Status Achieved      PT LONG  TERM GOAL #3   Title Pt will increase all LE MMT to no less than 4+/5 in order to improve functional activity tolerance and mobility    Baseline see flowsheet    Time 8    Period Weeks    Status Achieved                 Plan - 04/18/21 1559    Clinical Impression Statement Pt was once again able to complete prescribed exercises with no adverse effect and demonstrated knowledge of final HEP. Over the course of PT, she has been able to meet all LTG with exception of FOTO score, however, this did increase since initial evaluation. She notes improved standing tolerance and has not had back pain in last few weeks. She should continue to improve with HEP compliance and is being discharged at this time with HEP in place.    PT Treatment/Interventions ADLs/Self Care Home Management;Electrical Stimulation;Moist Heat;Traction;Gait training;Stair training;Functional mobility training;Therapeutic activities;Therapeutic exercise;Balance training;Neuromuscular re-education;Patient/family education;Manual techniques;Passive range of motion;Dry needling;Spinal Manipulations;Joint Manipulations    PT Home Exercise Plan [id number]    Consulted and Agree with Plan of Care Patient           Patient will benefit from skilled therapeutic intervention in order to improve the following deficits and impairments:  Abnormal gait,Decreased activity tolerance,Decreased endurance,Decreased range of motion,Decreased strength,Difficulty walking,Pain  Visit Diagnosis: Muscle weakness (generalized)  Impaired gait  Impaired functional mobility, balance, and endurance     Problem List Patient Active Problem List   Diagnosis Date Noted  . Polyneuropathy 02/01/2021  . History of mastoiditis 09/16/2018  . Hyperlipidemia LDL goal <130 06/23/2015  . Arthritis 09/13/2011  . Hypothyroidism 05/25/2011  . Allergic rhinitis due to pollen 05/25/2011  . Psoriasis 05/25/2011    Ward Chatters, PT, DPT 04/18/21 4:03  PM  [place], [place], [address] Phone: [phone]   Fax:  [phone]  Name: Kathy Freeman MRN: [id number] Date of Birth: 11-25-1940  PHYSICAL THERAPY DISCHARGE SUMMARY  Visits from Start of Care: 7  Current functional level related to goals / functional outcomes: See goals and objective   Remaining deficits: See objective   Education / Equipment: HEP  Plan: Patient agrees to discharge.  Patient goals were met. Patient is being discharged due to being pleased with the current functional level.  ?????

## 2021-05-10 ENCOUNTER — Ambulatory Visit (INDEPENDENT_AMBULATORY_CARE_PROVIDER_SITE_OTHER): Payer: Medicare Other | Admitting: Family Medicine

## 2021-05-10 ENCOUNTER — Other Ambulatory Visit: Payer: Self-pay

## 2021-05-10 VITALS — BP 148/82 | HR 76 | Temp 98.5°F | Ht 63.0 in | Wt 237.6 lb

## 2021-05-10 DIAGNOSIS — E785 Hyperlipidemia, unspecified: Secondary | ICD-10-CM

## 2021-05-10 DIAGNOSIS — Z8616 Personal history of COVID-19: Secondary | ICD-10-CM | POA: Diagnosis not present

## 2021-05-10 DIAGNOSIS — Z0000 Encounter for general adult medical examination without abnormal findings: Secondary | ICD-10-CM | POA: Diagnosis not present

## 2021-05-10 DIAGNOSIS — Z23 Encounter for immunization: Secondary | ICD-10-CM | POA: Diagnosis not present

## 2021-05-10 DIAGNOSIS — J301 Allergic rhinitis due to pollen: Secondary | ICD-10-CM

## 2021-05-10 DIAGNOSIS — E038 Other specified hypothyroidism: Secondary | ICD-10-CM

## 2021-05-10 DIAGNOSIS — M1990 Unspecified osteoarthritis, unspecified site: Secondary | ICD-10-CM

## 2021-05-10 DIAGNOSIS — H9113 Presbycusis, bilateral: Secondary | ICD-10-CM | POA: Diagnosis not present

## 2021-05-10 MED ORDER — LEVOTHYROXINE SODIUM 100 MCG PO TABS: ORAL_TABLET | ORAL | 3 refills | Status: DC

## 2021-05-10 MED ORDER — TRAMADOL HCL 50 MG PO TABS: 50.0000 mg | ORAL_TABLET | Freq: Four times a day (QID) | ORAL | 1 refills | Status: DC | PRN

## 2021-05-10 NOTE — Progress Notes (Deleted)
Kathy Freeman is a 80 y.o. female who presents for annual wellness visit and follow-up on chronic medical conditions.  She has the following concerns:  Immunizations and Health Maintenance Immunization History  Administered Date(s) Administered   Influenza, High Dose Seasonal PF 08/09/2016, 07/19/2017, 09/16/2018   Influenza,inj,quad, With Preservative 08/13/2017   Influenza-Unspecified 08/02/2014, 09/14/2015   Pneumococcal Conjugate-13 05/26/2014   Pneumococcal-Unspecified 11/13/2013   Tdap 03/26/2012   Zoster, Live 04/14/2013   Health Maintenance Due  Topic Date Due   COVID-19 Vaccine (1) Never done   Hepatitis C Screening  Never done   Zoster Vaccines- Shingrix (1 of 2) Never done   DEXA SCAN  Never done    Last Pap smear: aged out  Last mammogram: 06/29/2009 Last colonoscopy: 08/26/2009 Last DEXA: never Dentist: over two years Ophtho: Q year Exercise: stretching and balance for at least 20 min a day   Other doctors caring for patient include: Dr. Emily Filbert eye  Advanced directives: Does Patient Have a Medical Advance Directive?: Yes Type of Advance Directive: Living will Does patient want to make changes to medical advance directive?: No - Patient declined  Depression screen:  See questionnaire below.  Depression screen [facility] 2/9 05/10/2021 07/19/2017 06/23/2015 05/26/2014 03/26/2012  Decreased Interest 0 0 0 0 0  Down, Depressed, Hopeless 0 0 0 0 0  PHQ - 2 Score 0 0 0 0 0    Fall Risk Screen: see questionnaire below. Fall Risk  05/10/2021 07/19/2017 06/23/2015 05/26/2014  Falls in the past year? 0 Yes No No  Number falls in past yr: 0 1 - -  Injury with Fall? 0 Yes - -  Risk for fall due to : No Fall Risks Impaired balance/gait - -  Follow up Falls evaluation completed - - -    ADL screen:  See questionnaire below Functional Status Survey: Is the patient deaf or have difficulty hearing?: Yes Does the patient have difficulty seeing, even when wearing glasses/contacts?:  No Does the patient have difficulty concentrating, remembering, or making decisions?: No Does the patient have difficulty walking or climbing stairs?: Yes Does the patient have difficulty dressing or bathing?: No Does the patient have difficulty doing errands alone such as visiting a doctor's office or shopping?: No   Review of Systems Constitutional: -, -unexpected weight change, -anorexia, -fatigue Allergy: -sneezing, -itching, -congestion Dermatology: denies changing moles, rash, lumps ENT: -runny nose, -ear pain, -sore throat,  Cardiology:  -chest pain, -palpitations, -orthopnea, Respiratory: -cough, -shortness of breath, -dyspnea on exertion, -wheezing,  Gastroenterology: -abdominal pain, -nausea, -vomiting, -diarrhea, -constipation, -dysphagia Hematology: -bleeding or bruising problems Musculoskeletal: -arthralgias, -myalgias, -joint swelling, -back pain, - Ophthalmology: -vision changes,  Urology: -dysuria, -difficulty urinating,  -urinary frequency, -urgency, incontinence Neurology: -, -numbness, , -memory loss, -falls, -dizziness    PHYSICAL EXAM:  There were no vitals taken for this visit.  General Appearance: Alert, cooperative, no distress, appears stated age Head: Normocephalic, without obvious abnormality, atraumatic Eyes: PERRL, conjunctiva/corneas clear, EOM's intact, fundi benign Ears: Normal TM's and external ear canals Nose: Nares normal, mucosa normal, no drainage or sinus tenderness Throat: Lips, mucosa, and tongue normal; teeth and gums normal Neck: Supple, no lymphadenopathy;  thyroid:  no enlargement/tenderness/nodules; no carotid bruit or JVD Lungs: Clear to auscultation bilaterally without wheezes, rales or ronchi; respirations unlabored Heart: Regular rate and rhythm, S1 and S2 normal, no murmur, rubor gallop Abdomen: Soft, non-tender, nondistended, normoactive bowel sounds,  no masses, no hepatosplenomegaly Extremities: No clubbing, cyanosis or  edema Pulses: 2+ and  symmetric all extremities Skin:  Skin color, texture, turgor normal, no rashes or lesions Lymph nodes: Cervical, supraclavicular, and axillary nodes normal Neurologic:  CNII-XII intact, normal strength, sensation and gait; reflexes 2+ and symmetric throughout Psych: Normal mood, affect, hygiene and grooming.  ASSESSMENT/PLAN:    Discussed monthly self breast exams and yearly mammograms; at least 30 minutes of aerobic activity at least 5 days/week and weight-bearing exercise 2x/week; proper sunscreen use reviewed; healthy diet, including goals of calcium and vitamin D intake and alcohol recommendations (less than or equal to 1 drink/day) reviewed; regular seatbelt use; changing batteries in smoke detectors.  Immunization recommendations discussed.  Colonoscopy recommendations reviewed   Medicare Attestation I have personally reviewed: The patient's medical and social history Their use of alcohol, tobacco or illicit drugs Their current medications and supplements The patient's functional ability including ADLs,fall risks, home safety risks, cognitive, and hearing and visual impairment Diet and physical activities Evidence for depression or mood disorders  The patient's weight, height, and BMI have been recorded in the chart.  I have made referrals, counseling, and provided education to the patient based on review of the above and I have provided the patient with a written personalized care plan for preventive services.     Sharlot Gowda, MD   05/10/2021

## 2021-05-10 NOTE — Progress Notes (Signed)
Kathy Freeman is a 80 y.o. female who presents for annual wellness visit ,CPE and follow-up on chronic medical conditions.  She did have COVID in January but seems to recovered nicely from that.  She has been involved in physical therapy for the last several months.  They are apparently done with that.  She does complain of back as well as knee pain.  She has been using some compression stockings on her lower extremities for quite some time to help with the swelling.  She uses Tylenol for pain relief and also very low doses of tramadol that she usually breaks the pills up.  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  Her allergies seem to be under good control with Claritin.  She continues on Synthroid without problem.  She keeps herself very physically active but is made no changes in her eating habits.  Otherwise she has no particular concerns or complaints.  She is married and has no concerns.  Immunizations and Health Maintenance Immunization History  Administered Date(s) Administered   Influenza, High Dose Seasonal PF 08/09/2016, 07/19/2017, 09/16/2018   Influenza,inj,quad, With Preservative 08/13/2017   Influenza-Unspecified 08/02/2014, 09/14/2015   Pneumococcal Conjugate-13 05/26/2014   Pneumococcal-Unspecified 11/13/2013   Tdap 03/26/2012   Zoster, Live 04/14/2013   Health Maintenance Due  Topic Date Due   COVID-19 Vaccine (1) Never done   Hepatitis C Screening  Never done   Zoster Vaccines- Shingrix (1 of 2) Never done   DEXA SCAN  Never done     Last Pap smear: aged out  Last mammogram: 06/29/2009 Last colonoscopy: 08/26/2009 Last DEXA: never Dentist: over two years Ophtho: Q year Exercise: stretching and balance for at least 20 min a day  Other doctors caring for patient include:Ramos  Advanced directives:yes Does Patient Have a Medical Advance Directive?: Yes Type of Advance Directive: Living will Does patient want to make  changes to medical advance directive?: No - Patient declined  Depression screen:  See questionnaire below.  Depression screen [facility] 2/9 05/10/2021 07/19/2017 06/23/2015 05/26/2014 03/26/2012  Decreased Interest 0 0 0 0 0  Down, Depressed, Hopeless 0 0 0 0 0  PHQ - 2 Score 0 0 0 0 0    Fall Risk Screen: see questionnaire below. Fall Risk  05/10/2021 07/19/2017 06/23/2015 05/26/2014  Falls in the past year? 0 Yes No No  Number falls in past yr: 0 1 - -  Injury with Fall? 0 Yes - -  Risk for fall due to : No Fall Risks Impaired balance/gait - -  Follow up Falls evaluation completed - - -    ADL screen:  See questionnaire below Functional Status Survey: Is the patient deaf or have difficulty hearing?: Yes Does the patient have difficulty seeing, even when wearing glasses/contacts?: No Does the patient have difficulty concentrating, remembering, or making decisions?: No Does the patient have difficulty walking or climbing stairs?: Yes Does the patient have difficulty dressing or bathing?: No Does the patient have difficulty doing errands alone such as visiting a doctor's office or shopping?: No   Review of Systems Constitutional: -, -unexpected weight change, -anorexia, -fatigue Dermatology: denies changing moles, rash, lumps ENT: -runny nose, -ear pain, -sore throat,  Cardiology:  -chest pain, -palpitations, -orthopnea, Respiratory: -cough, -shortness of breath, -dyspnea on exertion, -wheezing,  Gastroenterology: -abdominal pain, -nausea, -vomiting, -diarrhea, -constipation, -dysphagia Hematology: -bleeding or bruising problems Musculoskeletal: -arthralgias, -myalgias, -joint swelling, -back pain, - Ophthalmology: -  vision changes,  Urology: -dysuria, -difficulty urinating,  -urinary frequency, -urgency, incontinence Neurology: -, -numbness, , -memory loss, -falls, -dizziness    PHYSICAL EXAM:  BP (!) 148/82   Pulse 76   Temp 98.5 F (36.9 C)   Ht 5\' 3"  (1.6 m)   Wt 237 lb 9.6 oz  (107.8 kg)   SpO2 96%   BMI 42.09 kg/m   General Appearance: Alert, cooperative, no distress, appears stated age Head: Normocephalic, without obvious abnormality, atraumatic Eyes: PERRL, conjunctiva/corneas clear, EOM's intact,  Ears: Normal TM's and external ear canals Nose: Nares normal, mucosa normal, no drainage or sinus tenderness Throat: Lips, mucosa, and tongue normal; teeth and gums normal Neck: Supple, no lymphadenopathy;  thyroid:  no enlargement/tenderness/nodules; no carotid bruit or JVD Lungs: Clear to auscultation bilaterally without wheezes, rales or ronchi; respirations unlabored Heart: Regular rate and rhythm, S1 and S2 normal, no murmur, rubor gallop Abdomen: Soft, non-tender, nondistended, normoactive bowel sounds,  no masses, no hepatosplenomegaly Extremities: No clubbing, cyanosis 3 + pitting edema Pulses: 2+ and symmetric all extremities Skin:  Skin color, texture, turgor normal, no rashes or lesions Lymph nodes: Cervical, supraclavicular, and axillary nodes normal Neurologic:  CNII-XII intact, normal strength, sensation and gait; reflexes 2+ and symmetric throughout Psych: Normal mood, affect, hygiene and grooming.  ASSESSMENT/PLAN: Routine general medical examination at a health care facility - Plan: Comprehensive metabolic panel, Lipid panel, DG Bone Density  Non-seasonal allergic rhinitis due to pollen  Other specified hypothyroidism - Plan: levothyroxine (SYNTHROID) 100 MCG tablet  Arthritis - Plan: traMADol (ULTRAM) 50 MG tablet  Hyperlipidemia LDL goal <130 - Plan: Lipid panel  Morbid obesity (HCC)  Presbycusis of both ears  History of COVID-19  Immunization, viral disease - Plan: PFIZER Comirnaty(GRAY TOP)COVID-19 Vaccine  Cut back on carbohydrates and the easiest thing to cut back on white food.  Bread, rice, pasta, potatoes, sugar.  Cut all those in half.  You can eat chicken and you can eat fish cut back on red meats Always use Tylenol first  and then use of tramadol with that if it does not work Get the shingles vaccine Recommend she get a hearing aid for both ears and potentially getting it from which apparently has good prices. Discussed at least 30 minutes of aerobic activity at least 5 days/week and weight-bearing exercise 2x/week; healthy diet, including goals of calcium and vitamin D intake   Immunization recommendations discussed.  Colonoscopy recommendations reviewed   Medicare Attestation I have personally reviewed: The patient's medical and social history Their use of alcohol, tobacco or illicit drugs Their current medications and supplements The patient's functional ability including ADLs,fall risks, home safety risks, cognitive, and hearing and visual impairment Diet and physical activities Evidence for depression or mood disorders  The patient's weight, height, and BMI have been recorded in the chart.  I have made referrals, counseling, and provided education to the patient based on review of the above and I have provided the patient with a written personalized care plan for preventive services.     [employer], MD   05/10/2021

## 2021-05-10 NOTE — Patient Instructions (Addendum)
  Kathy Freeman , Thank you for taking time to come for your Medicare Wellness Visit. I appreciate your ongoing commitment to your health goals. Please review the following plan we discussed and let me know if I can assist you in the future.   These are the goals we discussed: Cut back on carbohydrates and the easiest thing to cut back on white food.  Bread, rice, pasta, potatoes, sugar.  Cut all those in half.  You can eat chicken and you can eat fish cut back on red meats Always use Tylenol first and then use of tramadol with that if it does not work Get the shingles vaccine This is a list of the screening recommended for you and due dates:  Health Maintenance  Topic Date Due   COVID-19 Vaccine (1) Never done   Hepatitis C Screening: USPSTF Recommendation to screen - Ages 30-79 yo.  Never done   Zoster (Shingles) Vaccine (1 of 2) Never done   DEXA scan (bone density measurement)  Never done   Flu Shot  06/13/2021   Tetanus Vaccine  03/26/2022   Pneumonia vaccines  Completed   HPV Vaccine  Aged Out

## 2021-05-11 LAB — COMPREHENSIVE METABOLIC PANEL
ALT: 18 IU/L (ref 0–32)
AST: 27 IU/L (ref 0–40)
Albumin/Globulin Ratio: 1.7 (ref 1.2–2.2)
Albumin: 4.4 g/dL (ref 3.7–4.7)
Alkaline Phosphatase: 104 IU/L (ref 44–121)
BUN/Creatinine Ratio: 18 (ref 12–28)
BUN: 17 mg/dL (ref 8–27)
Bilirubin Total: 0.5 mg/dL (ref 0.0–1.2)
CO2: 24 mmol/L (ref 20–29)
Calcium: 9.5 mg/dL (ref 8.7–10.3)
Chloride: 105 mmol/L (ref 96–106)
Creatinine, Ser: 0.95 mg/dL (ref 0.57–1.00)
Globulin, Total: 2.6 g/dL (ref 1.5–4.5)
Glucose: 90 mg/dL (ref 65–99)
Potassium: 5.2 mmol/L (ref 3.5–5.2)
Sodium: 144 mmol/L (ref 134–144)
Total Protein: 7 g/dL (ref 6.0–8.5)
eGFR: 61 mL/min/{1.73_m2} (ref >59)

## 2021-05-11 LAB — LIPID PANEL
Chol/HDL Ratio: 4.4 ratio (ref 0.0–4.4)
Cholesterol, Total: 210 mg/dL — ABNORMAL HIGH (ref 100–199)
HDL: 48 mg/dL (ref >39)
LDL Chol Calc (NIH): 138 mg/dL — ABNORMAL HIGH (ref 0–99)
Triglycerides: 134 mg/dL (ref 0–149)
VLDL Cholesterol Cal: 24 mg/dL (ref 5–40)

## 2021-07-08 DIAGNOSIS — H5203 Hypermetropia, bilateral: Secondary | ICD-10-CM | POA: Diagnosis not present

## 2021-07-08 DIAGNOSIS — H25813 Combined forms of age-related cataract, bilateral: Secondary | ICD-10-CM | POA: Diagnosis not present

## 2021-07-08 DIAGNOSIS — H1789 Other corneal scars and opacities: Secondary | ICD-10-CM | POA: Diagnosis not present

## 2022-01-11 ENCOUNTER — Encounter: Payer: Self-pay | Admitting: Medical

## 2022-01-11 ENCOUNTER — Other Ambulatory Visit: Payer: Self-pay

## 2022-01-11 ENCOUNTER — Ambulatory Visit (INDEPENDENT_AMBULATORY_CARE_PROVIDER_SITE_OTHER): Payer: Medicare Other | Admitting: Medical

## 2022-01-11 VITALS — BP 122/80 | HR 102 | Temp 98.9°F | Wt 230.8 lb

## 2022-01-11 DIAGNOSIS — M1990 Unspecified osteoarthritis, unspecified site: Secondary | ICD-10-CM | POA: Diagnosis not present

## 2022-01-11 DIAGNOSIS — M79645 Pain in left finger(s): Secondary | ICD-10-CM | POA: Diagnosis not present

## 2022-01-11 DIAGNOSIS — M7989 Other specified soft tissue disorders: Secondary | ICD-10-CM

## 2022-01-11 NOTE — Progress Notes (Signed)
Subjective: ? Kathy Freeman is a 81 y.o. female who presents for ?Chief Complaint  ?Patient presents with  ? fingers swelling  ?  Left ring finger swollen for couple weeks  ?   ?Here for c/o few week hx/o left ring finger swollen and knot.   Is right handed.  She just noticed this nodule recently.  She has been using Biofreeze on this.  She says she puts Biofreeze on a lot of stuff for her body aches.  She notes a history of arthritis.  She denies history of gout or rheumatoid disease.  No family history of rheumatoid disease.  No injury, no trauma.  No other aggravating or relieving factors.  ? ?She says that she is active, always on the go.  She runs up and down stairs.  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 ?No other c/o. ? ?Past Medical History:  ?Diagnosis Date  ? Allergy   ? RHINITIS  ? Arthritis   ? Colonic polyp   ? Hemorrhoids   ? Obesity   ? Psoriasis   ? Raynaud disease   ? Thyroid disease   ? HYPOTHYROID  ? ?Current Outpatient Medications on File Prior to Visit  ?Medication Sig Dispense Refill  ? levothyroxine (SYNTHROID) 100 MCG tablet TAKE 1 TABLET BY MOUTH EVERY MORNING BEFORE BREAKFAST 90 tablet 3  ? Multiple Vitamins-Minerals (MULTIVITAMIN WITH MINERALS) tablet Take 1 tablet by mouth daily.    ? Nutritional Supplements (VITAMIN D BOOSTER PO) Take by mouth.    ? DULoxetine (CYMBALTA) 30 MG capsule Take 1 capsule (30 mg total) by mouth daily. (Patient not taking: Reported on 01/11/2022) 30 capsule 1  ? ibuprofen (ADVIL,MOTRIN) 200 MG tablet Take 200 mg by mouth every 6 (six) hours as needed. (Patient not taking: Reported on 01/11/2022)    ? loratadine (CLARITIN) 10 MG tablet Take 10 mg by mouth daily. (Patient not taking: Reported on 01/11/2022)    ? traMADol (ULTRAM) 50 MG tablet Take 1 tablet (50 mg total) by mouth every 6 (six) hours as needed. (Patient not taking: Reported on 01/11/2022) 30 tablet 1  ? ?No current facility-administered medications on file prior to visit.   ? ? ? ?The following portions of the patient's history were reviewed and updated as appropriate: allergies, current medications, past family history, past medical history, past social history, past surgical history and problem list. ? ?ROS ?Otherwise as in subjective above ? ?Objective: ?BP 122/80   Pulse (!) 102   Temp 98.9 ?F (37.2 ?C)   Wt 230 lb 12.8 oz (104.7 kg)   BMI 40.88 kg/m?  ? ?General appearance: alert, no distress, well developed, well nourished ?Hard of hearing ?Left fourth finger at the PIP with bony prominences to the joint medial and lateral portions of the dorsal aspect of the PIP.  There is some puffiness to the proximal phalanx of the fourth finger there is also puffiness at the MCP of the third and fourth finger on the left hand.  There is some other bony arthritic changes of both hands and fingers.  Not particular tender over the nodules of the fourth finger today.  No trigger finger. ?Squeeze test negative ?Hands neurovascularly intact ? ? ? ?Assessment: ?Encounter Diagnoses  ?Name Primary?  ? Arthritis Yes  ? Finger pain, left   ? Finger swelling   ? ? ? ?Plan: ?We discussed the finger findings.  Exam suggest some arthritic bony growth and some localized joint swelling  of the left hand MCPs and PIP of the fourth finger. ? ?Your finger swelling and nodule suggest some arthritic changes.  This can flareup from time to time ?It may be worse at times and nonpainful at all other times. ? ?Recommend using topical Aspercreme over-the-counter for discomfort.  Another option is Voltaren anti-inflammatory gel also available over-the-counter. ? ?On days that is worse she can use Tylenol over-the-counter 325 mg twice daily for pain short-term for a few days at a time if needed ? ?I am checking a blood test called uric acid today just to rule out elevated uric acid which could be associated with the another type of joint arthropathy ? ?When you have pain and swelling flareup you can rest the  hand. ? ? ? ?[name] was seen today for fingers swelling. ? ?Diagnoses and all orders for this visit: ? ?Arthritis ?-     Uric acid ?-     Sedimentation rate ? ?Finger pain, left ?-     Uric acid ?-     Sedimentation rate ? ?Finger swelling ?-     Uric acid ?-     Sedimentation rate ? ? ? ?Follow up: pending labs ?

## 2022-01-11 NOTE — Patient Instructions (Signed)
Your finger swelling and nodule suggest some arthritic changes.  This can flareup from time to time ?It may be worse at times and nonpainful at all other times. ? ?Recommend using topical Aspercreme over-the-counter for discomfort.  Another option is Voltaren anti-inflammatory gel also available over-the-counter. ? ?On days that is worse she can use Tylenol over-the-counter 325 mg twice daily for pain short-term for a few days at a time if needed ? ?I am checking a blood test called uric acid today just to rule out elevated uric acid which could be associated with the another type of joint arthropathy ? ?When you have pain and swelling flareup you can rest the hand. ?

## 2022-01-12 LAB — SEDIMENTATION RATE: Sed Rate: 9 mm/hr (ref 0–40)

## 2022-01-12 LAB — URIC ACID: Uric Acid: 4.4 mg/dL (ref 3.1–7.9)

## 2022-02-08 ENCOUNTER — Ambulatory Visit (INDEPENDENT_AMBULATORY_CARE_PROVIDER_SITE_OTHER): Payer: Medicare Other | Admitting: Medical

## 2022-02-08 ENCOUNTER — Ambulatory Visit
Admission: RE | Admit: 2022-02-08 | Discharge: 2022-02-08 | Disposition: A | Discharge status: Home / Self Care | Payer: Medicare Other | Source: Ambulatory Visit | Attending: Medical | Admitting: Medical

## 2022-02-08 ENCOUNTER — Other Ambulatory Visit: Payer: Self-pay

## 2022-02-08 VITALS — BP 140/80 | HR 81 | Temp 98.8°F | Wt 229.4 lb

## 2022-02-08 DIAGNOSIS — M1990 Unspecified osteoarthritis, unspecified site: Secondary | ICD-10-CM

## 2022-02-08 DIAGNOSIS — M85842 Other specified disorders of bone density and structure, left hand: Secondary | ICD-10-CM | POA: Diagnosis not present

## 2022-02-08 DIAGNOSIS — M79645 Pain in left finger(s): Secondary | ICD-10-CM | POA: Diagnosis not present

## 2022-02-08 DIAGNOSIS — M25742 Osteophyte, left hand: Secondary | ICD-10-CM | POA: Diagnosis not present

## 2022-02-08 DIAGNOSIS — M778 Other enthesopathies, not elsewhere classified: Secondary | ICD-10-CM | POA: Diagnosis not present

## 2022-02-08 DIAGNOSIS — M7989 Other specified soft tissue disorders: Secondary | ICD-10-CM

## 2022-02-08 DIAGNOSIS — M19041 Primary osteoarthritis, right hand: Secondary | ICD-10-CM | POA: Diagnosis not present

## 2022-02-08 MED ORDER — DICLOFENAC SODIUM 1 % EX GEL: 2.0000 g | Freq: Four times a day (QID) | CUTANEOUS | 2 refills | Status: DC

## 2022-02-08 NOTE — Progress Notes (Signed)
Subjective: ? Kathy Freeman is a 81 y.o. female who presents for ?Chief Complaint  ?Patient presents with  ? swollen finger  ?  Swollen ring finger. 6 weeks going on  ?   ?Here for recheck.  I saw her earlier in the month for finger pain and swelling.  Things are improving, she no longer feels a movable nodule in her left ring finger now but there is still some aches and pains.  Using Aspercreme.  She also uses ibuprofen over-the-counter sometimes 3 times a day.  Had a knot of left ring finger PIP ?Is right handed.  She just noticed this nodule recently.   She notes a history of arthritis.  She denies history of gout or rheumatoid disease.  No family history of rheumatoid disease.  No injury, no trauma.  No other aggravating or relieving factors.  ? ?She says that she is active, always on the go.  She runs up and down stairs.  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 ?No other c/o. ? ?Past Medical History:  ?Diagnosis Date  ? Allergy   ? RHINITIS  ? Arthritis   ? Colonic polyp   ? Hemorrhoids   ? Obesity   ? Psoriasis   ? Raynaud disease   ? Thyroid disease   ? HYPOTHYROID  ? ?Current Outpatient Medications on File Prior to Visit  ?Medication Sig Dispense Refill  ? levothyroxine (SYNTHROID) 100 MCG tablet TAKE 1 TABLET BY MOUTH EVERY MORNING BEFORE BREAKFAST 90 tablet 3  ? Multiple Vitamins-Minerals (MULTIVITAMIN WITH MINERALS) tablet Take 1 tablet by mouth daily.    ? Nutritional Supplements (VITAMIN D BOOSTER PO) Take by mouth.    ? ibuprofen (ADVIL,MOTRIN) 200 MG tablet Take 200 mg by mouth every 6 (six) hours as needed. (Patient not taking: Reported on 01/11/2022)    ? loratadine (CLARITIN) 10 MG tablet Take 10 mg by mouth daily. (Patient not taking: Reported on 01/11/2022)    ? ?No current facility-administered medications on file prior to visit.  ? ? ? ?The following portions of the patient's history were reviewed and updated as appropriate: allergies, current medications, past family  history, past medical history, past social history, past surgical history and problem list. ? ?ROS ?Otherwise as in subjective above ? ? ?Objective: ?BP 140/80   Pulse 81   Temp 98.8 ?F (37.1 ?C)   Wt 229 lb 6.4 oz (104.1 kg)   BMI 40.64 kg/m?  ? ?BP Readings from Last 3 Encounters:  ?02/08/22 140/80  ?01/11/22 122/80  ?05/10/21 (!) 148/82  ? ?General appearance: alert, no distress, well developed, well nourished ?Hard of hearing ?Left fourth finger at the PIP with bony prominences to the joint medial and lateral portions of the dorsal aspect of the PIP.  There is some puffiness to the proximal phalanx of the fourth finger there is also puffiness at the MCP of the third and fourth finger on the left hand.  There is some other bony arthritic changes of both hands and fingers.  Not particular tender over the nodules of the fourth finger today.  No trigger finger. ?Squeeze test negative ?Hands neurovascularly intact ? ? ? ?Assessment: ?Encounter Diagnoses  ?Name Primary?  ? Finger swelling Yes  ? Finger pain, left   ? Arthritis   ? ? ? ? ?Plan: ?I will send over x-rays.  We discussed avoiding heavy NSAID use.  She apparently has been using ibuprofen over-the-counter pretty regularly by mouth.  Discussed recommendations as below. ? ?Patient Instructions  ?Please go to [facility] for your hand and finger xrays.   Their hours are 8am - 4:30 pm Monday - Friday.  Take your insurance card with you. ? ?[facility] ?[phone] ? ?[address], [address] ?[place], [place] [address] ? ?[address] ?[place], [place] [address]  ? ? ? ?Recommendations: ?Consider using Voltaren Gel topically 2-4 times daily to the achy fingers ?Other option is Aspercreme over the counter topically ?On days that the pain is worse, consider Aleve over the counter, 1 tablet daily ?Let try Aleve instead of Ibuprofen.    ?Another option is to try Aleve in the morning, Tylenol in the afternoon ?Consider trying Essential Oils,  Lavender topically to the hand and fingers as well.  Some people feel improvement in pains with lavender. ?In general, we want to avoid using Aleve or Ibuprofen or other anti=inflammatories every day as chronic use of NSAIDs like ibuprofen can harm the kidneys and can impact blood pressure ? ? ? ?Charlotta was seen today for swollen finger. ? ?Diagnoses and all orders for this visit: ? ?Finger swelling ?-     DG Hand Complete Left; Future ?-     DG Hand Complete Right; Future ? ?Finger pain, left ?-     DG Hand Complete Left; Future ?-     DG Hand Complete Right; Future ? ?Arthritis ?-     DG Hand Complete Left; Future ?-     DG Hand Complete Right; Future ? ?Other orders ?-     diclofenac Sodium (VOLTAREN) 1 % GEL; Apply 2 g topically 4 (four) times daily. ? ? ? ?Follow up: pending labs ?

## 2022-02-08 NOTE — Patient Instructions (Signed)
Please go to [facility] for your hand and finger xrays.   Their hours are 8am - 4:30 pm Monday - Friday.  Take your insurance card with you. ? ?[facility] ?[id number] ? ?[address], [address] ?[place], [place] [address] ? ?[address] ?[place], [place] [address]  ? ? ? ?Recommendations: ?Consider using Voltaren Gel topically 2-4 times daily to the achy fingers ?Other option is Aspercreme over the counter topically ?On days that the pain is worse, consider Aleve over the counter, 1 tablet daily ?Let try Aleve instead of Ibuprofen.    ?Another option is to try Aleve in the morning, Tylenol in the afternoon ?Consider trying Essential Oils, Lavender topically to the hand and fingers as well.  Some people feel improvement in pains with lavender. ?In general, we want to avoid using Aleve or Ibuprofen or other anti=inflammatories every day as chronic use of NSAIDs like ibuprofen can harm the kidneys and can impact blood pressure ? ? ?

## 2022-02-13 ENCOUNTER — Other Ambulatory Visit: Payer: Self-pay | Admitting: Internal Medicine

## 2022-02-13 DIAGNOSIS — M1990 Unspecified osteoarthritis, unspecified site: Secondary | ICD-10-CM

## 2022-02-13 DIAGNOSIS — M7989 Other specified soft tissue disorders: Secondary | ICD-10-CM

## 2022-02-13 DIAGNOSIS — M79645 Pain in left finger(s): Secondary | ICD-10-CM

## 2022-02-15 ENCOUNTER — Ambulatory Visit (INDEPENDENT_AMBULATORY_CARE_PROVIDER_SITE_OTHER): Payer: Medicare Other | Admitting: Family Medicine

## 2022-02-15 ENCOUNTER — Encounter: Payer: Self-pay | Admitting: Family Medicine

## 2022-02-15 VITALS — BP 146/86 | HR 96 | Temp 97.0°F | Wt 231.0 lb

## 2022-02-15 DIAGNOSIS — R399 Unspecified symptoms and signs involving the genitourinary system: Secondary | ICD-10-CM | POA: Diagnosis not present

## 2022-02-15 DIAGNOSIS — J301 Allergic rhinitis due to pollen: Secondary | ICD-10-CM

## 2022-02-15 DIAGNOSIS — N3000 Acute cystitis without hematuria: Secondary | ICD-10-CM

## 2022-02-15 LAB — POCT URINALYSIS DIP (PROADVANTAGE DEVICE)
Bilirubin, UA: NEGATIVE
Blood, UA: NEGATIVE
Glucose, UA: NEGATIVE mg/dL
Ketones, POC UA: NEGATIVE mg/dL
Nitrite, UA: NEGATIVE
Protein Ur, POC: NEGATIVE mg/dL
Specific Gravity, Urine: 1.01
Urobilinogen, Ur: 0.2
pH, UA: 6 (ref 5.0–8.0)

## 2022-02-15 MED ORDER — DOXYCYCLINE HYCLATE 100 MG PO TABS: 100.0000 mg | ORAL_TABLET | Freq: Two times a day (BID) | ORAL | 0 refills | Status: DC

## 2022-02-15 NOTE — Progress Notes (Signed)
? ?  Subjective:  ? ? Patient ID: Kathy Freeman, female    DOB: Dec 01, 1940, 81 y.o.   MRN: [id number] ? ?HPI ?She has a 2-week history of difficulty with left gluteal pain that is relieved when she urinates.  No dysuria, discharge, fever or chills. ?She also has an underlying history of allergic rhinitis with sneezing, itchy watery eyes, rhinorrhea and congestion.  She has been using Claritin with some relief of her symptoms. ? ? ?Review of Systems ? ?   ?Objective:  ? Physical Exam ?Alert and in no distress otherwise not examined urine dipstick did show leukocytes. ? ? ? ?   ?Assessment & Plan:  ?UTI symptoms - Plan: POCT Urinalysis DIP (Proadvantage Device) ? ?Acute cystitis without hematuria ? ?Non-seasonal allergic rhinitis due to pollen ?Recommend she add Rhinocort to her regimen to see if that will help with her underlying allergies. ?She is to take the doxycycline and if still having difficulty, return for further evaluation. ? ?

## 2022-02-24 ENCOUNTER — Ambulatory Visit (INDEPENDENT_AMBULATORY_CARE_PROVIDER_SITE_OTHER): Payer: Medicare Other | Admitting: Orthopedic Surgery

## 2022-02-24 ENCOUNTER — Encounter: Payer: Self-pay | Admitting: Orthopedic Surgery

## 2022-02-24 DIAGNOSIS — M65341 Trigger finger, right ring finger: Secondary | ICD-10-CM | POA: Diagnosis not present

## 2022-02-24 NOTE — Progress Notes (Signed)
? ?Office Visit Note ?  ?Patient: Kathy Freeman           ?Date of Birth: 11-Jan-1941           ?MRN: [id number] ?Visit Date: 02/24/2022 ?             ?Requested by: Carlena Hurl, PA-C ?[address] ?[place],  [place] [address] ?PCP: Denita Lung, MD ? ? ?Assessment & Plan: ?Visit Diagnoses:  ?1. Trigger finger, right ring finger   ? ? ?Plan: Discussed with patient that her symptoms are most consistent with a trigger finger involving the right ring finger.  She has crepitus over the A1 pulley with range of motion.  There is no overt locking but she does not have full flexion of the finger secondary to pain.  Discussed treatment for trigger fingers including corticosteroid injection and A1 pulley release.  She is unable to tolerate corticosteroid injections secondary to her previous blistering reaction with a steroid.  She not interested in surgery at this point.  She wants to continue to observe her finger and will return the office if it worsens. ? ?Follow-Up Instructions: No follow-ups on file.  ? ?Orders:  ?No orders of the defined types were placed in this encounter. ? ?No orders of the defined types were placed in this encounter. ? ? ? ? Procedures: ?No procedures performed ? ? ?Clinical Data: ?No additional findings. ? ? ?Subjective: ?Chief Complaint  ?Patient presents with  ? Right Hand - New Patient (Initial Visit)  ? ? ?This is an 81 year old right-hand-dominant female who presents with right ring finger pain.  .  This been going on for some time now.  She describes pain that is worse with range of motion of the finger.  She has limited flexion of the finger.  The pain is localized to the area of the palmar digital crease in the proximal phalanx.  She is taken over-the-counter Advil as needed for pain.  She denies any injury to the finger.  She denies pain elsewhere in the hand. ? ? ?Review of Systems ? ? ?Objective: ?Vital Signs: There were no vitals taken for this visit. ? ?Physical  Exam ?Constitutional:   ?   Appearance: Normal appearance.  ?Cardiovascular:  ?   Rate and Rhythm: Normal rate.  ?   Pulses: Normal pulses.  ?Pulmonary:  ?   Effort: Pulmonary effort is normal.  ?Skin: ?   General: Skin is warm and dry.  ?   Capillary Refill: Capillary refill takes less than 2 seconds.  ?Neurological:  ?   Mental Status: She is alert.  ? ? ?Right Hand Exam  ? ?Tenderness  ?Right hand tenderness location: TTP at ring finger around A1 pulley and proximal phalanx.  Mild swelling around base of finger. ? ?Other  ?Erythema: absent ?Sensation: normal ?Pulse: present ? ?Comments:  Lacks full flexion of ring finger secondary to pain.  Palpable crepitus and nodule at A1 pulley with PROM of finger.  No overt locking.  ? ? ? ? ?Specialty Comments:  ?No specialty comments available. ? ?Imaging: ?No results found. ? ? ?PMFS History: ?Patient Active Problem List  ? Diagnosis Date Noted  ? Trigger finger, right ring finger 02/24/2022  ? Finger pain, left 01/11/2022  ? Finger swelling 01/11/2022  ? Polyneuropathy 02/01/2021  ? History of mastoiditis 09/16/2018  ? Hyperlipidemia LDL goal <130 06/23/2015  ? Arthritis 09/13/2011  ? Hypothyroidism 05/25/2011  ? Allergic rhinitis due to pollen 05/25/2011  ?  Psoriasis 05/25/2011  ? ?Past Medical History:  ?Diagnosis Date  ? Allergy   ? RHINITIS  ? Arthritis   ? Colonic polyp   ? Hemorrhoids   ? Obesity   ? Psoriasis   ? Raynaud disease   ? Thyroid disease   ? HYPOTHYROID  ?  ?Family History  ?Problem Relation Age of Onset  ? Arthritis Mother   ? Diabetes Mother   ? Heart disease Mother   ? Arthritis Father   ? Cancer Father   ? Diabetes Father   ? Hypertension Father   ? Diabetes Sister   ? Heart disease Sister   ? Hypertension Sister   ? Diabetes Brother   ? Mental illness Brother   ? Hypertension Brother   ?  ?Past Surgical History:  ?Procedure Laterality Date  ? ABDOMINAL HYSTERECTOMY  1985  ? CHOLECYSTECTOMY    ? Left ear    ? ?Social History  ? ?Occupational History   ? Not on file  ?Tobacco Use  ? Smoking status: Never  ? Smokeless tobacco: Never  ?Substance and Sexual Activity  ? Alcohol use: No  ? Drug use: No  ? Sexual activity: Yes  ?  Birth control/protection: None  ? ? ? ? ? ? ?

## 2022-04-19 ENCOUNTER — Ambulatory Visit (INDEPENDENT_AMBULATORY_CARE_PROVIDER_SITE_OTHER): Payer: Medicare Other | Admitting: Medical

## 2022-04-19 ENCOUNTER — Ambulatory Visit
Admission: RE | Admit: 2022-04-19 | Discharge: 2022-04-19 | Disposition: A | Discharge status: Home / Self Care | Payer: Medicare Other | Source: Ambulatory Visit | Attending: Medical | Admitting: Medical

## 2022-04-19 ENCOUNTER — Encounter: Payer: Self-pay | Admitting: Medical

## 2022-04-19 VITALS — BP 124/80 | HR 88 | Temp 99.0°F | Wt 233.2 lb

## 2022-04-19 DIAGNOSIS — M1990 Unspecified osteoarthritis, unspecified site: Secondary | ICD-10-CM

## 2022-04-19 DIAGNOSIS — M25552 Pain in left hip: Secondary | ICD-10-CM

## 2022-04-19 DIAGNOSIS — E038 Other specified hypothyroidism: Secondary | ICD-10-CM | POA: Diagnosis not present

## 2022-04-19 DIAGNOSIS — M5136 Other intervertebral disc degeneration, lumbar region: Secondary | ICD-10-CM

## 2022-04-19 MED ORDER — MELOXICAM 7.5 MG PO TABS: 7.5000 mg | ORAL_TABLET | Freq: Every day | ORAL | 0 refills | Status: DC

## 2022-04-19 NOTE — Patient Instructions (Signed)
Begin trial of Meloxicam daily for pain and inflammation.  Use this for about a week and then use as needed.    Please go to [facility] for your left hip xray.   Their hours are 8am - 4:30 pm Monday - Friday.  Take your insurance card with you.  [facility] [phone]  [address], [address] [place], [place] [address]  [address] [place], [place] [address]

## 2022-04-19 NOTE — Progress Notes (Signed)
Subjective:  Kathy Freeman is a 81 y.o. female who presents for Chief Complaint  Patient presents with   Hip Pain    Hip pain for a while, saw Dr. Susann Freeman for this and was given doxcycline and only was able to take 2 as it was a laxative for her     Here for left hip pain.  She notes that she had similar pains at her last visit but there was some discussion apparently about constipation.  She ended up doing a laxative twice but was on the toilet for 20 minutes with the lot of defecation.  She does not want to do that again.  She feels like she lost all of her nutrients with a laxative.  Her main concern is left hip pain.  No recent fall or injury.  No specific back pain.  Hurts more on the left side of her leg.  She has seen [facility] in the past for back pain.  Surgery is not recommended.  She does not think they examined her hip or did a hip x-ray.  She feels like Tylenol gives her headaches she does not only use Tylenol.  No other aggravating or relieving factors.    No other c/o.  Past Medical History:  Diagnosis Date   Allergy    RHINITIS   Arthritis    Colonic polyp    Hemorrhoids    Obesity    Psoriasis    Raynaud disease    Thyroid disease    HYPOTHYROID   Current Outpatient Medications on File Prior to Visit  Medication Sig Dispense Refill   levothyroxine (SYNTHROID) 100 MCG tablet TAKE 1 TABLET BY MOUTH EVERY MORNING BEFORE BREAKFAST 90 tablet 3   loratadine (CLARITIN) 10 MG tablet Take 10 mg by mouth daily.     Multiple Vitamins-Minerals (MULTIVITAMIN WITH MINERALS) tablet Take 1 tablet by mouth daily.     Nutritional Supplements (VITAMIN D BOOSTER PO) Take by mouth.     No current facility-administered medications on file prior to visit.     The following portions of the patient's history were reviewed and updated as appropriate: allergies, current medications, past family history, past medical history, past social history, past surgical history and problem  list.  ROS Otherwise as in subjective above  Objective: BP 124/80   Pulse 88   Temp 99 F (37.2 C)   Wt 233 lb 3.2 oz (105.8 kg)   BMI 41.31 kg/m   General appearance: alert, no distress, well developed, well nourished Mild tenderness in left lumbar paraspinal region, tender over left lateral hip, tender over left greater trochanter and buttock in general.  She does seem to have some pain with range of motion of hip which is somewhat reduced.  Rest the leg nontender without obvious swelling or deformity.  She does have compression hose on today. Leg strength seems normal, DTRs a little blunted of legs Otherwise, legs neurovascularly intact  Lumbar spine xray 02/11/21: IMPRESSION: Scoliosis. Multilevel osteoarthritic change which is progressed from prior study. Stable 5 mm of anterolisthesis of L4 on L5, likely due to spondylosis. No new spondylolisthesis. No acute fracture. 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      Assessment: Encounter Diagnoses  Name Primary?   Pain of left hip Yes   Other specified hypothyroidism    Arthritis    DDD (degenerative disc disease), lumbar      Plan: We discussed symptoms and concerns.  I reviewed her back x-ray  from last year which showed some quite significant arthritis and degenerative changes.  She does not seem to have any bowel issues currently so advised not to use a laxative.  I will send for x-ray.  In the meantime begin meloxicam.  Discussed risk and benefits of medicine and other options for medication.  She does not want to use Tylenol.  We discussed that there might be some component of bursitis but could also have some hip arthritis as well.  Kathy Freeman was seen today for hip pain.  Diagnoses and all orders for this visit:  Pain of left hip -     DG HIP UNILAT WITH PELVIS 2-3 VIEWS LEFT; Future  Other specified hypothyroidism  Arthritis  DDD (degenerative disc disease),  lumbar  Other orders -     meloxicam (MOBIC) 7.5 MG tablet; Take 1 tablet (7.5 mg total) by mouth daily.    Follow up: pending xray

## 2022-04-21 ENCOUNTER — Other Ambulatory Visit: Payer: Self-pay | Admitting: Family Medicine

## 2022-04-21 DIAGNOSIS — M1990 Unspecified osteoarthritis, unspecified site: Secondary | ICD-10-CM

## 2022-04-21 NOTE — Telephone Encounter (Signed)
This was discontinued as pt was finished with this medicine and since that appt shane prescribed mobic for pt for pt

## 2022-05-06 ENCOUNTER — Other Ambulatory Visit: Payer: Self-pay | Admitting: Family Medicine

## 2022-05-06 DIAGNOSIS — E038 Other specified hypothyroidism: Secondary | ICD-10-CM

## 2022-05-18 ENCOUNTER — Ambulatory Visit (INDEPENDENT_AMBULATORY_CARE_PROVIDER_SITE_OTHER): Payer: Medicare Other | Admitting: Family Medicine

## 2022-05-18 ENCOUNTER — Encounter: Payer: Self-pay | Admitting: Family Medicine

## 2022-05-18 VITALS — BP 138/86 | Temp 99.0°F | Wt 228.8 lb

## 2022-05-18 DIAGNOSIS — Z0000 Encounter for general adult medical examination without abnormal findings: Secondary | ICD-10-CM

## 2022-05-18 DIAGNOSIS — Z13220 Encounter for screening for lipoid disorders: Secondary | ICD-10-CM | POA: Diagnosis not present

## 2022-05-18 DIAGNOSIS — Z8669 Personal history of other diseases of the nervous system and sense organs: Secondary | ICD-10-CM

## 2022-05-18 DIAGNOSIS — E038 Other specified hypothyroidism: Secondary | ICD-10-CM

## 2022-05-18 DIAGNOSIS — G629 Polyneuropathy, unspecified: Secondary | ICD-10-CM

## 2022-05-18 DIAGNOSIS — E785 Hyperlipidemia, unspecified: Secondary | ICD-10-CM

## 2022-05-18 DIAGNOSIS — J301 Allergic rhinitis due to pollen: Secondary | ICD-10-CM

## 2022-05-18 DIAGNOSIS — M1990 Unspecified osteoarthritis, unspecified site: Secondary | ICD-10-CM

## 2022-05-18 DIAGNOSIS — L409 Psoriasis, unspecified: Secondary | ICD-10-CM | POA: Diagnosis not present

## 2022-05-18 MED ORDER — LEVOTHYROXINE SODIUM 100 MCG PO TABS
100.0000 ug | ORAL_TABLET | Freq: Every day | ORAL | 3 refills | Status: DC
Start: 2022-05-18 — End: 2023-07-25

## 2022-05-18 NOTE — Patient Instructions (Signed)

## 2022-05-18 NOTE — Progress Notes (Signed)
Kathy Freeman is a 81 y.o. female who presents for annual wellness visit,CPE and follow-up on chronic medical conditions.  She does have underlying allergies and these seem to be under good control on her present medication regimen.  She continues on her thyroid medicine and having no difficulty with that.  She has had previous history of mastoiditis and subsequent surgery however she is in the process of being evaluated for a different hearing aid as the previous one did not work properly.  She does have a neuropathy and states that Biofreeze does help with that.  She does have arthritis states Advil has not been successful however she has not really taken the maximum dosing.  Otherwise she has no particular concerns or complaints.  Immunizations and Health Maintenance Immunization History  Administered Date(s) Administered   Influenza, High Dose Seasonal PF 08/09/2016, 07/19/2017, 09/16/2018   Influenza,inj,quad, With Preservative 08/13/2017   Influenza-Unspecified 08/02/2014, 09/14/2015   PFIZER Comirnaty(Gray Top)Covid-19 Tri-Sucrose Vaccine 05/10/2021   Pneumococcal Conjugate-13 05/26/2014   Pneumococcal-Unspecified 11/13/2013   Tdap 03/26/2012   Zoster, Live 04/14/2013   Health Maintenance Due  Topic Date Due   Zoster Vaccines- Shingrix (1 of 2) Never done   Pneumonia Vaccine 63+ Years old (2 - PPSV23 if available, else PCV20) 05/27/2015   TETANUS/TDAP  03/26/2022    Last Pap smear:aged out  Last mammogram: 06/29/09 Last colonoscopy: 08/26/09 Last DEXA: Pt declines  Dentist: Q year Ophtho: Q year Exercise: walking for greater than one hour   Other doctors caring for patient include: Dr. Frazier Butt ortho            Dr. Emily Filbert  eye               Advanced directives: Does Patient Have a Medical Advance Directive?: Yes Type of Advance Directive: Healthcare Power of Attorney Does patient want to make changes to medical advance directive?: No - Patient declined Copy of Healthcare  Power of Attorney in Chart?: Yes - validated most recent copy scanned in chart (See row information)  Depression screen:  See questionnaire below.     05/18/2022    2:09 PM 05/10/2021    1:45 PM 07/19/2017    8:55 AM 06/23/2015   10:51 AM 05/26/2014   10:19 AM  Depression screen PHQ 2/9  Decreased Interest 0 0 0 0 0  Down, Depressed, Hopeless 0 0 0 0 0  PHQ - 2 Score 0 0 0 0 0    Fall Risk Screen: see questionnaire below.    05/18/2022    2:08 PM 05/10/2021    1:45 PM 07/19/2017    8:55 AM 06/23/2015   10:51 AM 05/26/2014   10:19 AM  Fall Risk   Falls in the past year? 0 0 Yes No No  Number falls in past yr: 0 0 1    Injury with Fall? 0 0 Yes    Risk for fall due to : No Fall Risks No Fall Risks Impaired balance/gait    Follow up Falls evaluation completed Falls evaluation completed       ADL screen:  See questionnaire below Functional Status Survey: Is the patient deaf or have difficulty hearing?: Yes Does the patient have difficulty seeing, even when wearing glasses/contacts?: Yes Does the patient have difficulty concentrating, remembering, or making decisions?: No Does the patient have difficulty walking or climbing stairs?: No Does the patient have difficulty dressing or bathing?: No Does the patient have difficulty doing errands alone such as visiting a doctor's  office or shopping?: No   Review of Systems Constitutional: -, -unexpected weight change, -anorexia, -fatigue Allergy: -sneezing, -itching, -congestion Dermatology: denies changing moles, rash, lumps ENT: -runny nose, -ear pain, -sore throat,  Cardiology:  -chest pain, -palpitations, -orthopnea, Respiratory: -cough, -shortness of breath, -dyspnea on exertion, -wheezing,  Gastroenterology: -abdominal pain, -nausea, -vomiting, -diarrhea, -constipation, -dysphagia Hematology: -bleeding or bruising problems Musculoskeletal: -arthralgias, -myalgias, -joint swelling, -back pain, - Ophthalmology: -vision changes,   Urology: -dysuria, -difficulty urinating,  -urinary frequency, -urgency, incontinence Neurology: -, -numbness, , -memory loss, -falls, -dizziness    PHYSICAL EXAM:  BP 138/86   Temp 99 F (37.2 C)   Wt 228 lb 12.8 oz (103.8 kg)   BMI 40.53 kg/m   General Appearance: Alert, cooperative, no distress, appears stated age Head: Normocephalic, without obvious abnormality, atraumatic Eyes: PERRL, conjunctiva/corneas clear, EOM's intact, Ears: Right TM and canal is normal.  Left TM landmarks were difficult to see due to previous surgery.   Nose: Nares normal, mucosa normal, no drainage or sinus tenderness Throat: Lips, mucosa, and tongue normal; teeth and gums normal Neck: Supple, no lymphadenopathy;  thyroid:  no enlargement/tenderness/nodules; no carotid bruit or JVD Lungs: Clear to auscultation bilaterally without wheezes, rales or ronchi; respirations unlabored Heart: Regular rate and rhythm, S1 and S2 normal, no murmur, rubor gallop Abdomen: Soft, non-tender, nondistended, normoactive bowel sounds,  no masses, no hepatosplenomegaly Extremities: No clubbing, cyanosis or edema Pulses: 2+ and symmetric all extremities Skin:  Skin color, texture, turgor normal, no rashes or lesions Lymph nodes: Cervical, supraclavicular, and axillary nodes normal Neurologic:  CNII-XII intact, normal strength, sensation and gait; reflexes 2+ and symmetric throughout Psych: Normal mood, affect, hygiene and grooming.  ASSESSMENT/PLAN: Routine general medical examination at a health care facility - Plan: CBC with Differential/Platelet, Comprehensive metabolic panel, Lipid panel, POCT Urinalysis DIP (Proadvantage Device)  Non-seasonal allergic rhinitis due to pollen  Other specified hypothyroidism - Plan: TSH, levothyroxine (SYNTHROID) 100 MCG tablet  Polyneuropathy - Plan: CBC with Differential/Platelet, Comprehensive metabolic panel  Arthritis  Hyperlipidemia LDL goal <130 - Plan: Lipid  panel  Screening for lipid disorders  History of mastoiditis Recommend she try 2 Aleve twice per day to help with her pain and if no improvement, she will call me.  Apparently tramadol has been successful in the past.  She will follow-up with her audiologist as well as ENT because of her left hearing deficit.  Recommend she get Tdap at the drugstore.  Continue on Claritin.   Discussed  .  Immunization recommendations discussed.  Colonoscopy recommendations reviewed   Medicare Attestation I have personally reviewed: The patient's medical and social history Their use of alcohol, tobacco or illicit drugs Their current medications and supplements The patient's functional ability including ADLs,fall risks, home safety risks, cognitive, and hearing and visual impairment Diet and physical activities Evidence for depression or mood disorders  The patient's weight, height, and BMI have been recorded in the chart.  I have made referrals, counseling, and provided education to the patient based on review of the above and I have provided the patient with a written personalized care plan for preventive services.     Sharlot Gowda, MD   05/18/2022

## 2022-05-19 LAB — CBC WITH DIFFERENTIAL/PLATELET
Basophils Absolute: 0 10*3/uL (ref 0.0–0.2)
Basos: 0 %
EOS (ABSOLUTE): 0.1 10*3/uL (ref 0.0–0.4)
Eos: 2 %
Hematocrit: 40 % (ref 34.0–46.6)
Hemoglobin: 13.3 g/dL (ref 11.1–15.9)
Immature Grans (Abs): 0 10*3/uL (ref 0.0–0.1)
Immature Granulocytes: 0 %
Lymphocytes Absolute: 1.9 10*3/uL (ref 0.7–3.1)
Lymphs: 25 %
MCH: 28.5 pg (ref 26.6–33.0)
MCHC: 33.3 g/dL (ref 31.5–35.7)
MCV: 86 fL (ref 79–97)
Monocytes Absolute: 0.5 10*3/uL (ref 0.1–0.9)
Monocytes: 6 %
Neutrophils Absolute: 5.1 10*3/uL (ref 1.4–7.0)
Neutrophils: 67 %
Platelets: 292 10*3/uL (ref 150–450)
RBC: 4.66 x10E6/uL (ref 3.77–5.28)
RDW: 13 % (ref 11.7–15.4)
WBC: 7.7 10*3/uL (ref 3.4–10.8)

## 2022-05-19 LAB — COMPREHENSIVE METABOLIC PANEL
ALT: 12 IU/L (ref 0–32)
AST: 20 IU/L (ref 0–40)
Albumin/Globulin Ratio: 1.8 (ref 1.2–2.2)
Albumin: 4.4 g/dL (ref 3.7–4.7)
Alkaline Phosphatase: 99 IU/L (ref 44–121)
BUN/Creatinine Ratio: 21 (ref 12–28)
BUN: 19 mg/dL (ref 8–27)
Bilirubin Total: 0.5 mg/dL (ref 0.0–1.2)
CO2: 24 mmol/L (ref 20–29)
Calcium: 9.5 mg/dL (ref 8.7–10.3)
Chloride: 104 mmol/L (ref 96–106)
Creatinine, Ser: 0.92 mg/dL (ref 0.57–1.00)
Globulin, Total: 2.4 g/dL (ref 1.5–4.5)
Glucose: 90 mg/dL (ref 70–99)
Potassium: 5.1 mmol/L (ref 3.5–5.2)
Sodium: 143 mmol/L (ref 134–144)
Total Protein: 6.8 g/dL (ref 6.0–8.5)
eGFR: 63 mL/min/{1.73_m2} (ref >59)

## 2022-05-19 LAB — POCT URINALYSIS DIP (PROADVANTAGE DEVICE)
Bilirubin, UA: NEGATIVE
Blood, UA: NEGATIVE
Glucose, UA: NEGATIVE mg/dL
Ketones, POC UA: NEGATIVE mg/dL
Leukocytes, UA: NEGATIVE
Nitrite, UA: NEGATIVE
Protein Ur, POC: NEGATIVE mg/dL
Specific Gravity, Urine: 1.01
Urobilinogen, Ur: 0.2
pH, UA: 6 (ref 5.0–8.0)

## 2022-05-19 LAB — LIPID PANEL
Chol/HDL Ratio: 3.9 ratio (ref 0.0–4.4)
Cholesterol, Total: 205 mg/dL — ABNORMAL HIGH (ref 100–199)
HDL: 53 mg/dL (ref >39)
LDL Chol Calc (NIH): 134 mg/dL — ABNORMAL HIGH (ref 0–99)
Triglycerides: 99 mg/dL (ref 0–149)
VLDL Cholesterol Cal: 18 mg/dL (ref 5–40)

## 2022-05-19 LAB — TSH: TSH: 1.12 u[IU]/mL (ref 0.450–4.500)

## 2022-05-23 ENCOUNTER — Other Ambulatory Visit: Payer: Self-pay | Admitting: Medical

## 2022-05-23 NOTE — Telephone Encounter (Signed)
Looks like this was discontinued at her appt on 7/6

## 2022-06-09 ENCOUNTER — Other Ambulatory Visit: Payer: Self-pay | Admitting: Medical

## 2022-07-05 ENCOUNTER — Ambulatory Visit (INDEPENDENT_AMBULATORY_CARE_PROVIDER_SITE_OTHER): Payer: Medicare Other | Admitting: Medical

## 2022-07-05 VITALS — BP 122/80 | HR 78 | Temp 98.9°F | Wt 229.4 lb

## 2022-07-05 DIAGNOSIS — H66002 Acute suppurative otitis media without spontaneous rupture of ear drum, left ear: Secondary | ICD-10-CM | POA: Diagnosis not present

## 2022-07-05 MED ORDER — OFLOXACIN 0.3 % OT SOLN: 5.0000 [drp] | Freq: Every day | OTIC | 0 refills | Status: DC

## 2022-07-05 NOTE — Progress Notes (Signed)
Subjective:  Kathy Freeman is a 81 y.o. female who presents for Chief Complaint  Patient presents with   ear issue    Ear issue- draining a lot on left side and ringing     Here for a few days of left ear ringing, left ear discomfort and some drainage of brown liquid from the left ear.  She has a history of ear surgery on that ear years ago.  She has not seen an ENT in several years and no recent ear infection in the last several years.  No recent swimming.  No recent sick contact with similar.  No fever, no sinus pressure, no sore throat, no body aches or chills.  No other aggravating or relieving factors.    No other c/o.  The following portions of the patient's history were reviewed and updated as appropriate: allergies, current medications, past family history, past medical history, past social history, past surgical history and problem list.  ROS Otherwise as in subjective above  Objective: BP 122/80   Pulse 78   Temp 98.9 F (37.2 C)   Wt 229 lb 6.4 oz (104.1 kg)   BMI 40.64 kg/m   General appearance: alert, no distress, well developed, well nourished HEENT: normocephalic, sclerae anicteric, conjunctiva pink and moist, right ear, TM and canal normal, left TM without erythema but there is some pus in the ear canal and decreased light reflex on the eardrum, nares patent, no discharge or erythema, pharynx normal    Assessment: Encounter Diagnosis  Name Primary?   Acute suppurative otitis media of left ear without spontaneous rupture of tympanic membrane, recurrence not specified Yes     Plan: Advise good water intake, begin eardrops below.  If not improving over the next 3 to 5 days then recheck.  If not completely back to normal within a week then return.  Kathy Freeman was seen today for ear issue.  Diagnoses and all orders for this visit:  Acute suppurative otitis media of left ear without spontaneous rupture of tympanic membrane, recurrence not specified  Other orders -      ofloxacin (FLOXIN) 0.3 % OTIC solution; Place 5 drops into the left ear daily.    Follow up: prn

## 2022-07-11 DIAGNOSIS — H5203 Hypermetropia, bilateral: Secondary | ICD-10-CM | POA: Diagnosis not present

## 2022-07-11 DIAGNOSIS — H25813 Combined forms of age-related cataract, bilateral: Secondary | ICD-10-CM | POA: Diagnosis not present

## 2022-07-19 ENCOUNTER — Encounter: Payer: Self-pay | Admitting: Internal Medicine

## 2022-07-24 ENCOUNTER — Other Ambulatory Visit: Payer: Self-pay | Admitting: Medical

## 2022-08-22 ENCOUNTER — Encounter: Payer: Self-pay | Admitting: Internal Medicine

## 2022-08-30 ENCOUNTER — Other Ambulatory Visit: Payer: Self-pay | Admitting: Medical

## 2022-08-30 ENCOUNTER — Other Ambulatory Visit: Payer: Self-pay | Admitting: Family Medicine

## 2022-08-30 NOTE — Telephone Encounter (Signed)
Is this okay to refill? 

## 2022-08-30 NOTE — Telephone Encounter (Signed)
Left detailed message that pt will need to be seen before we can refill this

## 2022-09-04 ENCOUNTER — Encounter: Payer: Self-pay | Admitting: Internal Medicine

## 2022-09-13 ENCOUNTER — Ambulatory Visit (INDEPENDENT_AMBULATORY_CARE_PROVIDER_SITE_OTHER): Payer: Medicare Other | Admitting: Family Medicine

## 2022-09-13 ENCOUNTER — Encounter: Payer: Self-pay | Admitting: Family Medicine

## 2022-09-13 VITALS — BP 138/88 | HR 92 | Ht 61.0 in | Wt 230.0 lb

## 2022-09-13 DIAGNOSIS — H9312 Tinnitus, left ear: Secondary | ICD-10-CM | POA: Diagnosis not present

## 2022-09-13 DIAGNOSIS — H9113 Presbycusis, bilateral: Secondary | ICD-10-CM | POA: Diagnosis not present

## 2022-09-13 DIAGNOSIS — Z23 Encounter for immunization: Secondary | ICD-10-CM | POA: Diagnosis not present

## 2022-09-13 NOTE — Progress Notes (Signed)
   Subjective:    Patient ID: Kathy Freeman, female    DOB: 11/30/1940, 81 y.o.   MRN: [id number]  HPI She complains of ringing in the left ear as well as having wax in her ear.  No ear pain or drainage.  She states that the hearing aids sometimes do cause some discomfort.  Review of Systems     Objective:   Physical Exam Alert and in no distress.  Both tympanic membranes are normal.  No wax is noted in the left canal and some is in the right but not enough to remove.       Assessment & Plan:  Tinnitus of left ear  Presbycusis of both ears  Need for COVID-19 vaccine I explained that there is nothing that we can do for the tinnitus.  Did recommend she check with her hearing aid provider to make sure that it is fitting properly. Recommend she arrange to get the flu shot.

## 2022-12-21 ENCOUNTER — Encounter: Payer: Self-pay | Admitting: Family Medicine

## 2022-12-21 ENCOUNTER — Ambulatory Visit (INDEPENDENT_AMBULATORY_CARE_PROVIDER_SITE_OTHER): Payer: Medicare Other | Admitting: Family Medicine

## 2022-12-21 VITALS — BP 138/84 | HR 81 | Temp 98.5°F | Ht 61.0 in | Wt 223.2 lb

## 2022-12-21 DIAGNOSIS — L219 Seborrheic dermatitis, unspecified: Secondary | ICD-10-CM | POA: Diagnosis not present

## 2022-12-21 NOTE — Progress Notes (Signed)
   Subjective:    Patient ID: Kathy Freeman, female    DOB: September 22, 1941, 82 y.o.   MRN: [id number]  HPI She complains of a rash present on the left medial ankle that she has been using cortisone cream regularly for the last month.  She is doing it twice per day and states that it is roughly 50% better.  She has no lesions anywhere else.   Review of Systems     Objective:   Physical Exam Reddish slightly dry and scaly 4 x 6 cm area is noted on the left medial ankle area.       Assessment & Plan:  Seborrheic dermatitis Continue with cortisone twice per day sparingly.  Also recommend white cotton socks to be worn regularly for at least a week at a time to keep the cream from washing off.  If no improvement, she will call and I will refer to dermatology.

## 2022-12-28 ENCOUNTER — Ambulatory Visit (INDEPENDENT_AMBULATORY_CARE_PROVIDER_SITE_OTHER): Payer: Medicare Other | Admitting: Medical

## 2022-12-28 VITALS — BP 110/70 | HR 99 | Temp 99.0°F | Wt 222.6 lb

## 2022-12-28 DIAGNOSIS — M5440 Lumbago with sciatica, unspecified side: Secondary | ICD-10-CM | POA: Diagnosis not present

## 2022-12-28 DIAGNOSIS — M5410 Radiculopathy, site unspecified: Secondary | ICD-10-CM | POA: Diagnosis not present

## 2022-12-28 DIAGNOSIS — M25561 Pain in right knee: Secondary | ICD-10-CM

## 2022-12-28 DIAGNOSIS — M25562 Pain in left knee: Secondary | ICD-10-CM

## 2022-12-28 DIAGNOSIS — G8929 Other chronic pain: Secondary | ICD-10-CM | POA: Diagnosis not present

## 2022-12-28 MED ORDER — ACETAMINOPHEN-CODEINE 300-30 MG PO TABS: 1.0000 | ORAL_TABLET | Freq: Two times a day (BID) | ORAL | 0 refills | Status: DC | PRN

## 2022-12-28 NOTE — Progress Notes (Signed)
Sent to caryn to hand with referral

## 2022-12-28 NOTE — Patient Instructions (Signed)
Chronic back pain and chronic knee pain: You have a prior x-ray of your back in 2022 that was abnormal You may be having worsening arthritis in the back and the knees For now you can use over-the-counter Tylenol either 500 mg once or twice a day for pain Or you can use the prescription Tylenol with codeine that I prescribed today once or twice a day as needed for worse pain.  The prescription pain medicine has Tylenol in it, so do not take over-the-counter Tylenol within 4 hours of this prescription You can use Aleve over-the-counter once daily as needed but preferably not every day You can use topical creams such as Aspercreme or capsaicin cream to your legs or knees If you have access to the [facility] you could go do water therapy or water aerobics there that could be helpful for your pain If your pain is continue to be persistent, the neck step would be referral to orthopedics  Alternatively, we could pursue x-rays of both knees and MRI of your lumbar spine to further evaluate your condition

## 2022-12-28 NOTE — Progress Notes (Signed)
Subjective:  Kathy Freeman is a 82 y.o. female who presents for Chief Complaint  Patient presents with   leg pain    Legs- bottom- knees pain x 1 week. Having to walk with a stick. The other day in grocery store her foot went numb while walking     Here for back and knee pains and leg pains.  She has chronic pain in her back legs and knees, both legs, pain sometimes just in the knees, sometimes the entire legs.  Sometimes feels a little weak in her legs.  She does get back pains.  Lately in the past 2 weeks pain has been a little bit worse.  No fall injury or trauma.  No swelling.  No numbness or tingling.  She is not currently going to a fitness facility but is interested in going to pool therapy  Using over-the-counter ibuprofen and Tylenol.  Using ibuprofen throughout the day.  Using Aspercreme and Biofreeze topically.  No other aggravating or relieving factors.    No other c/o.   Past Medical History:  Diagnosis Date   Allergy    RHINITIS   Arthritis    Colonic polyp    Hemorrhoids    Obesity    Psoriasis    Raynaud disease    Thyroid disease    HYPOTHYROID   Current Outpatient Medications on File Prior to Visit  Medication Sig Dispense Refill   ibuprofen (ADVIL) 200 MG tablet Take 400 mg by mouth every 6 (six) hours as needed.     levothyroxine (SYNTHROID) 100 MCG tablet Take 1 tablet (100 mcg total) by mouth daily. 90 tablet 3   loratadine (CLARITIN) 10 MG tablet Take 10 mg by mouth daily.     Menthol, Topical Analgesic, (BIOFREEZE ROLL-ON) 4 % GEL      Multiple Vitamins-Minerals (MULTIVITAMIN WITH MINERALS) tablet Take 1 tablet by mouth daily.     Nutritional Supplements (VITAMIN D BOOSTER PO) Take by mouth.     No current facility-administered medications on file prior to visit.     The following portions of the patient's history were reviewed and updated as appropriate: allergies, current medications, past family history, past medical history, past social history,  past surgical history and problem list.  ROS Otherwise as in subjective above  Objective: BP 110/70   Pulse 99   Temp 99 F (37.2 C)   Wt 222 lb 9.6 oz (101 kg)   BMI 42.06 kg/m   General appearance: alert, no distress, well developed, well nourished Back with generalized tenderness in the mid to low back throughout, no obvious swelling, range of motion about 50% of normal Hard to ascertain where she hurts on leg exam.  In some places minimal touching seems to elicit some pain but then other times in the exam the same areas do not seem to elicit pain.  No obvious laxity of her knees, hip range of motion seems to be relatively normal.  She does have surgical scars of both knees from prior trauma.  No other obvious pain with range of motion of ankles or hips but she seems to be tender at random places when I touch along her thighs and lower legs.  No obvious frank significant area causing pain Leg strength seems to be within normal limits, she does walk with a cane or walking stick, she seems a little shuffled with gait Pulses: 2+ radial pulses, 2+ pedal pulses, normal cap refill Ext: no edema   Assessment: Encounter Diagnoses  Name Primary?   Chronic bilateral low back pain with sciatica, sciatica laterality unspecified Yes   Chronic pain of both knees    Radicular leg pain      Plan: Is somewhat difficult to figure out where her pain truly is on exam as she will yelp if he touches certain areas but she does not seem to be in considerable pain just on palpation today  I reviewed her imaging of the lumbar spine x-ray from 2022 that was abnormal.  There is a left knee x-ray from 2018 that was reviewed there is an x-ray of left hip 2023 that was reviewed  I will help her with some medication today and give her some relief we will go ahead and refer to orthopedics.  I strongly recommended she do pool therapy at the [facility] aquatic center which she seems interested in doing.  She  does work at the [employer] so the [employer] center would be a good fit for her  Patient Instructions  Chronic back pain and chronic knee pain: You have a prior x-ray of your back in 2022 that was abnormal You may be having worsening arthritis in the back and the knees For now you can use over-the-counter Tylenol either 500 mg once or twice a day for pain Or you can use the prescription Tylenol with codeine that I prescribed today once or twice a day as needed for worse pain.  The prescription pain medicine has Tylenol in it, so do not take over-the-counter Tylenol within 4 hours of this prescription You can use Aleve over-the-counter once daily as needed but preferably not every day You can use topical creams such as Aspercreme or capsaicin cream to your legs or knees If you have access to the [facility] you could go do water therapy or water aerobics there that could be helpful for your pain If your pain is continue to be persistent, the neck step would be referral to orthopedics  Alternatively, we could pursue x-rays of both knees and MRI of your lumbar spine to further evaluate your condition   Cielo was seen today for leg pain.  Diagnoses and all orders for this visit:  Chronic bilateral low back pain with sciatica, sciatica laterality unspecified -     Ambulatory referral to Orthopedics  Chronic pain of both knees -     Ambulatory referral to Orthopedics  Radicular leg pain -     Ambulatory referral to Orthopedics  Other orders -     acetaminophen-codeine (TYLENOL #3) 300-30 MG tablet; Take 1 tablet by mouth 2 (two) times daily as needed for moderate pain.   Follow up: pending referral to orthopedics

## 2023-01-01 ENCOUNTER — Ambulatory Visit: Payer: Medicare Other | Admitting: Medical

## 2023-01-04 DIAGNOSIS — M5136 Other intervertebral disc degeneration, lumbar region: Secondary | ICD-10-CM | POA: Diagnosis not present

## 2023-01-05 DIAGNOSIS — M25562 Pain in left knee: Secondary | ICD-10-CM | POA: Diagnosis not present

## 2023-01-05 DIAGNOSIS — M170 Bilateral primary osteoarthritis of knee: Secondary | ICD-10-CM | POA: Diagnosis not present

## 2023-01-05 DIAGNOSIS — M25561 Pain in right knee: Secondary | ICD-10-CM | POA: Diagnosis not present

## 2023-01-31 ENCOUNTER — Encounter: Payer: Self-pay | Admitting: Family Medicine

## 2023-01-31 ENCOUNTER — Ambulatory Visit (INDEPENDENT_AMBULATORY_CARE_PROVIDER_SITE_OTHER): Payer: Medicare Other | Admitting: Family Medicine

## 2023-01-31 VITALS — BP 142/92 | HR 96 | Temp 98.1°F | Resp 20 | Wt 222.0 lb

## 2023-01-31 DIAGNOSIS — L309 Dermatitis, unspecified: Secondary | ICD-10-CM

## 2023-01-31 NOTE — Progress Notes (Signed)
   Subjective:    Patient ID: Kathy Freeman, female    DOB: [birth year], 82 y.o.   MRN: [id number]  HPI He is here for recheck on rash present on the left medial foot.  She has been using steroid cream on it and seeing no improvement since her last visit.   Review of Systems     Objective:   Physical Exam Exam of the left foot shows 3 x 4 cm area of brownish-red pigment change that is not hot red tender or draining.       Assessment & Plan:  Dermatitis - Plan: Ambulatory referral to Dermatology

## 2023-02-12 ENCOUNTER — Telehealth: Payer: Self-pay | Admitting: Family Medicine

## 2023-02-12 NOTE — Telephone Encounter (Signed)
Pt called about the referral to the dermatologist . States she has not heard anything from anyone Please advise

## 2023-03-07 ENCOUNTER — Encounter: Payer: Self-pay | Admitting: Medical

## 2023-03-07 ENCOUNTER — Ambulatory Visit (INDEPENDENT_AMBULATORY_CARE_PROVIDER_SITE_OTHER): Payer: Medicare Other | Admitting: Medical

## 2023-03-07 VITALS — BP 126/86 | HR 77 | Wt 220.6 lb

## 2023-03-07 DIAGNOSIS — R200 Anesthesia of skin: Secondary | ICD-10-CM | POA: Diagnosis not present

## 2023-03-07 DIAGNOSIS — M79675 Pain in left toe(s): Secondary | ICD-10-CM | POA: Diagnosis not present

## 2023-03-07 DIAGNOSIS — M79672 Pain in left foot: Secondary | ICD-10-CM

## 2023-03-07 DIAGNOSIS — M79671 Pain in right foot: Secondary | ICD-10-CM

## 2023-03-07 DIAGNOSIS — M79674 Pain in right toe(s): Secondary | ICD-10-CM | POA: Diagnosis not present

## 2023-03-07 MED ORDER — GABAPENTIN 100 MG PO CAPS: 100.0000 mg | ORAL_CAPSULE | Freq: Every day | ORAL | 0 refills | Status: DC

## 2023-03-07 MED ORDER — DICLOFENAC SODIUM 1 % EX GEL: CUTANEOUS | 0 refills | Status: DC

## 2023-03-07 NOTE — Patient Instructions (Signed)
Your foot and leg symptoms could represent neuropathy, electrolyte dysfunction, or other  We will check a few labs to evaluate  I recommend you follow-up with orthopedics and asking about nerve conduction studies to check the nerve function in your legs  It may be worth doing some physical therapy to help with balance issues in light of foot pain and numbness and history of low back pain  Begin trial of Gabapentin  daily at bedtime.  After 2 weeks if not improving, increase to 2 capsules daily at bedtime  Use your cane.    Consider water aerobics at the gym

## 2023-03-07 NOTE — Progress Notes (Signed)
Subjective:  Kathy Freeman is a 82 y.o. female who presents for Chief Complaint  Patient presents with   Arthritis    Arthritis has been flaring up and causing pain, wants to know if there is anything to calm toes during neuropathy episodes of tingling     Here for some arthritis problems.  She has history of arthritis, history of chronic back pain.  Lately she is having flareup of pain and arthritis in her toes and feet and ankles.  She has numbness at times in both feet throughout.  Lately she has felt less stable on her feet and having to lower her cane more.  Changes in humidity and weather sometimes aggravates her arthritis.  She has she just saw orthopedics recently [facility] about her back.  No prior nerve conduction studies.  She takes a B12 supplement daily.  No alcohol  No recent fall injury or trauma.  She has a discoloration of her left medial foot that she is seeing dermatology for in the next few weeks.  No other aggravating or relieving factors.    No other c/o.  Past Medical History:  Diagnosis Date   Allergy    RHINITIS   Arthritis    Colonic polyp    Hemorrhoids    Obesity    Psoriasis    Raynaud disease    Thyroid disease    HYPOTHYROID   Current Outpatient Medications on File Prior to Visit  Medication Sig Dispense Refill   acetaminophen-codeine (TYLENOL #3) 300-30 MG tablet Take 1 tablet by mouth 2 (two) times daily as needed for moderate pain. 30 tablet 0   ibuprofen (ADVIL) 200 MG tablet Take 400 mg by mouth every 6 (six) hours as needed.     levothyroxine (SYNTHROID) 100 MCG tablet Take 1 tablet (100 mcg total) by mouth daily. 90 tablet 3   loratadine (CLARITIN) 10 MG tablet Take 10 mg by mouth daily.     Menthol, Topical Analgesic, (BIOFREEZE ROLL-ON) 4 % GEL      Multiple Vitamins-Minerals (MULTIVITAMIN WITH MINERALS) tablet Take 1 tablet by mouth daily.     Nutritional Supplements (VITAMIN D BOOSTER PO) Take by mouth.     No current  facility-administered medications on file prior to visit.     The following portions of the patient's history were reviewed and updated as appropriate: allergies, current medications, past family history, past medical history, past social history, past surgical history and problem list.  ROS Otherwise as in subjective above   Objective: BP 126/86   Pulse 77   Wt 220 lb 9.6 oz (100.1 kg)   SpO2 95%   BMI 41.68 kg/m   General appearance: alert, no distress, well developed, well nourished Skin: Left foot over the deltoid region medially with a salmon-colored area approximately 3 cm x 2 cm, no wound or sore Flat feet bilaterally, some tenderness in bilateral lower legs in general but no obvious swelling or deformity or asymmetry, feet, toes, and ankle nontender today, ankle range of motion within normal limits No leg swelling or deformity Moderate spider veins throughout legs and feet Pulses decreased 1+ bilateral feet, but normal cap refill Feet with normal strength legs of normal strength and sensation of bilateral feet and toes decreased monofilament throughout  Of note ABI normal 11/2021     Assessment: Encounter Diagnoses  Name Primary?   Foot pain, bilateral Yes   Toe pain, bilateral    Numbness of feet      Plan: Your  foot and leg symptoms could represent neuropathy, electrolyte dysfunction, or other  We will check a few labs to evaluate  I recommend you follow-up with orthopedics and asking about nerve conduction studies to check the nerve function in your legs  It may be worth doing some physical therapy to help with balance issues in light of foot pain and numbness and history of low back pain  Begin trial of Gabapentin  daily at bedtime.  After 2 weeks if not improving, increase to 2 capsules daily at bedtime  Use your cane.    Consider water aerobics at the gym   Kathy Freeman was seen today for arthritis.  Diagnoses and all orders for this visit:  Foot  pain, bilateral -     Magnesium -     Basic metabolic panel  Toe pain, bilateral -     Magnesium -     Basic metabolic panel  Numbness of feet -     Magnesium -     Basic metabolic panel  Other orders -     diclofenac Sodium (VOLTAREN) 1 % GEL; APPLY 2 GRAMS TOPICALLY TO THE AFFECTED AREA FOUR TIMES DAILY -     gabapentin (NEURONTIN) 100 MG capsule; Take 1 capsule (100 mg total) by mouth at bedtime. After 2 weeks can increase to 2 capsules or  at bedtime    Follow up: pending labs, consult back with ortho

## 2023-03-08 LAB — BASIC METABOLIC PANEL
BUN/Creatinine Ratio: 19 (ref 12–28)
BUN: 18 mg/dL (ref 8–27)
CO2: 24 mmol/L (ref 20–29)
Calcium: 9.6 mg/dL (ref 8.7–10.3)
Chloride: 103 mmol/L (ref 96–106)
Creatinine, Ser: 0.93 mg/dL (ref 0.57–1.00)
Glucose: 92 mg/dL (ref 70–99)
Potassium: 4.8 mmol/L (ref 3.5–5.2)
Sodium: 142 mmol/L (ref 134–144)
eGFR: 62 mL/min/{1.73_m2} (ref >59)

## 2023-03-08 LAB — MAGNESIUM: Magnesium: 2.1 mg/dL (ref 1.6–2.3)

## 2023-03-08 NOTE — Progress Notes (Signed)
Magnesium and electrolytes are normal  I recommend you consider follow-up with orthopedics and ask about nerve conduction studies in your legs.  In the meantime begin the trial of gabapentin we discussedFinal

## 2023-03-12 ENCOUNTER — Telehealth: Payer: Self-pay

## 2023-03-12 NOTE — Telephone Encounter (Signed)
Pt called to advise she no longer has a computer. So any communication will need to be done through her phone rather than myChart.

## 2023-03-28 DIAGNOSIS — M25572 Pain in left ankle and joints of left foot: Secondary | ICD-10-CM | POA: Diagnosis not present

## 2023-03-28 DIAGNOSIS — M79671 Pain in right foot: Secondary | ICD-10-CM | POA: Diagnosis not present

## 2023-03-28 DIAGNOSIS — M79672 Pain in left foot: Secondary | ICD-10-CM | POA: Diagnosis not present

## 2023-03-28 DIAGNOSIS — M25571 Pain in right ankle and joints of right foot: Secondary | ICD-10-CM | POA: Diagnosis not present

## 2023-03-28 DIAGNOSIS — M19071 Primary osteoarthritis, right ankle and foot: Secondary | ICD-10-CM | POA: Diagnosis not present

## 2023-03-28 DIAGNOSIS — M19072 Primary osteoarthritis, left ankle and foot: Secondary | ICD-10-CM | POA: Diagnosis not present

## 2023-04-05 NOTE — Progress Notes (Signed)
sent 

## 2023-05-03 DIAGNOSIS — M19071 Primary osteoarthritis, right ankle and foot: Secondary | ICD-10-CM | POA: Diagnosis not present

## 2023-05-03 DIAGNOSIS — R252 Cramp and spasm: Secondary | ICD-10-CM | POA: Diagnosis not present

## 2023-05-03 DIAGNOSIS — I872 Venous insufficiency (chronic) (peripheral): Secondary | ICD-10-CM | POA: Diagnosis not present

## 2023-05-03 DIAGNOSIS — M19072 Primary osteoarthritis, left ankle and foot: Secondary | ICD-10-CM | POA: Diagnosis not present

## 2023-05-24 DIAGNOSIS — L409 Psoriasis, unspecified: Secondary | ICD-10-CM | POA: Insufficient documentation

## 2023-05-24 DIAGNOSIS — L400 Psoriasis vulgaris: Secondary | ICD-10-CM | POA: Diagnosis not present

## 2023-06-13 ENCOUNTER — Ambulatory Visit (INDEPENDENT_AMBULATORY_CARE_PROVIDER_SITE_OTHER): Payer: Medicare Other | Admitting: Medical

## 2023-06-13 VITALS — BP 124/84 | HR 96 | Temp 98.6°F | Wt 210.8 lb

## 2023-06-13 DIAGNOSIS — H9312 Tinnitus, left ear: Secondary | ICD-10-CM | POA: Diagnosis not present

## 2023-06-13 DIAGNOSIS — H9192 Unspecified hearing loss, left ear: Secondary | ICD-10-CM | POA: Diagnosis not present

## 2023-06-13 DIAGNOSIS — R300 Dysuria: Secondary | ICD-10-CM

## 2023-06-13 DIAGNOSIS — N3946 Mixed incontinence: Secondary | ICD-10-CM | POA: Diagnosis not present

## 2023-06-13 LAB — POCT URINALYSIS DIP (PROADVANTAGE DEVICE)
Bilirubin, UA: NEGATIVE
Blood, UA: NEGATIVE
Glucose, UA: NEGATIVE mg/dL
Ketones, POC UA: NEGATIVE mg/dL
Leukocytes, UA: NEGATIVE
Nitrite, UA: NEGATIVE
Protein Ur, POC: NEGATIVE mg/dL
Specific Gravity, Urine: 1.005
Urobilinogen, Ur: NEGATIVE
pH, UA: 6 (ref 5.0–8.0)

## 2023-06-13 NOTE — Progress Notes (Signed)
Subjective:  Kathy Freeman is a 82 y.o. female who presents for Chief Complaint  Patient presents with   urine issues    Urine issues- burning with urination- no urgency to go to bathroom Left ear- ringing a lot- used to use hearing aid but caused more issues so took it out     Here for urine and ringing in ears  She notes few week hx/o burning with urination. Has hx/o some incontinence.  No frequency or urgency.  Has had some chills but no fever.   No blood or odor in urine.  Last UTI long time again.  No recent change in hygiene products.   Drinking plenty of water.   She has ringing in left ear.  Was uses hearing aid in left ear, but quit using it due to drainage brown wax. Now just hears buzz.  Last used hearing aid a month ago.  Her hearing aids came from [facility] [facility] on [address].    Has had the hearing aids a long time.  Off balance, no numbness, tingling or weakness, no headache, no recent illness.  Sometimes has occasional cough.   Always has sneezing chronic allergy problems.   No other aggravating or relieving factors.    No other c/o.  Past Medical History:  Diagnosis Date   Allergy    RHINITIS   Arthritis    Colonic polyp    Hemorrhoids    Obesity    Psoriasis    Raynaud disease    Thyroid disease    HYPOTHYROID   Current Outpatient Medications on File Prior to Visit  Medication Sig Dispense Refill   diclofenac Sodium (VOLTAREN) 1 % GEL APPLY 2 GRAMS TOPICALLY TO THE AFFECTED AREA FOUR TIMES DAILY 100 g 0   ibuprofen (ADVIL) 200 MG tablet Take 400 mg by mouth every 6 (six) hours as needed.     levothyroxine (SYNTHROID) 100 MCG tablet Take 1 tablet (100 mcg total) by mouth daily. 90 tablet 3   loratadine (CLARITIN) 10 MG tablet Take 10 mg by mouth daily.     Menthol, Topical Analgesic, (BIOFREEZE ROLL-ON) 4 % GEL      Multiple Vitamins-Minerals (MULTIVITAMIN WITH MINERALS) tablet Take 1 tablet by mouth daily.     Nutritional Supplements (VITAMIN D BOOSTER PO) Take  by mouth.     No current facility-administered medications on file prior to visit.     The following portions of the patient's history were reviewed and updated as appropriate: allergies, current medications, past family history, past medical history, past social history, past surgical history and problem list.  ROS Otherwise as in subjective above    Objective: BP 124/84   Pulse 96   Temp 98.6 F (37 C)   Wt 210 lb 12.8 oz (95.6 kg)   BMI 39.83 kg/m   General appearance: alert, no distress, well developed, well nourished HEENT: normocephalic, sclerae anicteric, conjunctiva pink and moist, TMs pearly, nares patent, no discharge or erythema, pharynx normal Oral cavity: MMM, no lesions Neck: supple, no lymphadenopathy, no thyromegaly, no masses Neuro: Somewhat cautious without her cane but otherwise CN II to XII intact with nonfocal exam Abdomen: nontender, no mass or organomegaly   Assessment: Encounter Diagnoses  Name Primary?   Tinnitus of left ear Yes   Hearing loss of left ear, unspecified hearing loss type    Mixed stress and urge urinary incontinence    Burning with urination      Plan: Tinnitus, ringing in the ear, hearing  loss there is no particular cure for ringing of the ears,but this is likely due to hearing loss Consider consult with ENT specialist or trial of different hearing aids if yours aren't working quite properly No sign of ear infection today Consider changing to trial of Flonase and or allegra for a week instead of plain Claritin to see if this helps at all. Let me know if you want referral back to ENT  Incontinence, burning with urination I will send urine for culture to see if any infection Urine results today is normal Limit caffeine as this can cause burning with urination Drink plenty of water daily such as 80 ounces daily Use mild soap and water for hygiene If not improving, we can refer to urology    [name] was seen today for urine  issues.  Diagnoses and all orders for this visit:  Tinnitus of left ear  Hearing loss of left ear, unspecified hearing loss type  Mixed stress and urge urinary incontinence -     POCT Urinalysis DIP (Proadvantage Device)  Burning with urination -     Urine Culture -     POCT Urinalysis DIP (Proadvantage Device)    Follow up: pending culture

## 2023-06-13 NOTE — Patient Instructions (Signed)
Tinnitus, ringing in the ear, hearing loss there is no particular cure for ringing of the ears,but this is likely due to hearing loss Consider consult with ENT specialist or trial of different hearing aids if yours aren't working quite properly No sign of ear infection today Let me know if you want referral back to ENT  Incontinence, burning with urination I will send urine for culture to see if any infection Urine results today is normal Limit caffeine as this can cause burning with urination Drink plenty of water daily such as 80 ounces daily Use mild soap and water for hygiene If not improving, we can refer to urology

## 2023-06-15 ENCOUNTER — Other Ambulatory Visit: Payer: Self-pay | Admitting: Medical

## 2023-06-15 MED ORDER — NITROFURANTOIN MONOHYD MACRO 100 MG PO CAPS: 100.0000 mg | ORAL_CAPSULE | Freq: Two times a day (BID) | ORAL | 0 refills | Status: DC

## 2023-06-15 NOTE — Progress Notes (Signed)
Urine shows a little bit of infection not a real bad serious infection.  Begin Macrobid antibiotic twice daily for a week.  Drink plenty water.  If not completely improved within 2 weeks then let me know

## 2023-07-04 DIAGNOSIS — M4186 Other forms of scoliosis, lumbar region: Secondary | ICD-10-CM | POA: Diagnosis not present

## 2023-07-04 DIAGNOSIS — M5136 Other intervertebral disc degeneration, lumbar region: Secondary | ICD-10-CM | POA: Diagnosis not present

## 2023-07-04 DIAGNOSIS — M545 Low back pain, unspecified: Secondary | ICD-10-CM | POA: Diagnosis not present

## 2023-07-13 DIAGNOSIS — H2513 Age-related nuclear cataract, bilateral: Secondary | ICD-10-CM | POA: Diagnosis not present

## 2023-07-13 DIAGNOSIS — H5203 Hypermetropia, bilateral: Secondary | ICD-10-CM | POA: Diagnosis not present

## 2023-07-13 DIAGNOSIS — H1789 Other corneal scars and opacities: Secondary | ICD-10-CM | POA: Diagnosis not present

## 2023-07-25 ENCOUNTER — Ambulatory Visit (INDEPENDENT_AMBULATORY_CARE_PROVIDER_SITE_OTHER): Payer: Medicare Other | Admitting: Medical

## 2023-07-25 VITALS — BP 130/80 | HR 90 | Temp 98.1°F | Wt 211.0 lb

## 2023-07-25 DIAGNOSIS — E038 Other specified hypothyroidism: Secondary | ICD-10-CM | POA: Diagnosis not present

## 2023-07-25 DIAGNOSIS — M436 Torticollis: Secondary | ICD-10-CM

## 2023-07-25 DIAGNOSIS — M25612 Stiffness of left shoulder, not elsewhere classified: Secondary | ICD-10-CM

## 2023-07-25 DIAGNOSIS — M542 Cervicalgia: Secondary | ICD-10-CM

## 2023-07-25 DIAGNOSIS — M25512 Pain in left shoulder: Secondary | ICD-10-CM

## 2023-07-25 MED ORDER — DICLOFENAC SODIUM 1 % EX GEL: CUTANEOUS | 0 refills | Status: AC

## 2023-07-25 MED ORDER — LEVOTHYROXINE SODIUM 100 MCG PO TABS: 100.0000 ug | ORAL_TABLET | Freq: Every day | ORAL | 0 refills | Status: DC

## 2023-07-25 MED ORDER — TIZANIDINE HCL 4 MG PO TABS: 4.0000 mg | ORAL_TABLET | Freq: Two times a day (BID) | ORAL | 0 refills | Status: DC | PRN

## 2023-07-25 NOTE — Progress Notes (Signed)
I have put in order for PT

## 2023-07-25 NOTE — Patient Instructions (Signed)
Neck pain, decreased range of motion of neck, shoulder pain: You seem to have some neck tension and spasm today but also likely has some neck arthritis as well.  There is no x-ray on file but we can consider this going forward Use stretching and range of motion exercise daily for your neck and shoulder You can use ibuprofen over-the-counter, 2 tablets once or twice daily as needed for pain You can also use Tylenol and alternate with ibuprofen for pain every 4-6 hours short-term You can use lidocaine topical cream for pain You can use heat for the neck spasm such as hot towel or hot shower I recommend you do some neck and shoulder exercises as we discussed Begin prescription tizanidine muscle relaxer once or twice daily for the next few days for neck tension and spasm.  Caution this can cause drowsiness I will refer you to physical therapy for neck and arm pain and neck spasm  I refilled your thyroid medicine today as you were running out

## 2023-07-25 NOTE — Progress Notes (Signed)
Subjective: Chief Complaint  Patient presents with   Neck Pain    Neck pain and shoulder pain, going on for a couple weeks    Here for neck and shoulder pain.  Left shoulder.  Thinks its somewhat related to using cane.  Hurts in back of neck.  No recent injjry or fall.  No numbness, tinglin, no weakness in arms.   Using advil for symptoms, a few times dailiy sometimes.  Uses equate lidocaine patch on the neck as well.  No other aggravating or relieving factors. No other complaint.  Past Medical History:  Diagnosis Date   Allergy    RHINITIS   Arthritis    Colonic polyp    Hemorrhoids    Obesity    Psoriasis    Raynaud disease    Thyroid disease    HYPOTHYROID   Current Outpatient Medications on File Prior to Visit  Medication Sig Dispense Refill   ibuprofen (ADVIL) 200 MG tablet Take 400 mg by mouth every 6 (six) hours as needed.     loratadine (CLARITIN) 10 MG tablet Take 10 mg by mouth daily.     Multiple Vitamins-Minerals (MULTIVITAMIN WITH MINERALS) tablet Take 1 tablet by mouth daily.     Nutritional Supplements (VITAMIN D BOOSTER PO) Take by mouth.     nitrofurantoin, macrocrystal-monohydrate, (MACROBID) 100 MG capsule Take 1 capsule (100 mg total) by mouth 2 (two) times daily. 14 capsule 0   No current facility-administered medications on file prior to visit.   ROS as in subjective   Objective: BP 130/80   Pulse 90   Temp 98.1 F (36.7 C)   Wt 211 lb (95.7 kg)   BMI 39.87 kg/m    Gen: wd, wn, nad Neck with tenderness on the left lateral neck and decreased range of motion with left neck rotation, extension also decreased range of motion, otherwise relatively normal range of motion, no mass, no thyromegaly MSK: Mild tenderness over the left AC joint, otherwise nontender to palpation, slightly reduced internal range of motion of left shoulder, otherwise all nontender with normal range of motion and no laxity Arms neurovascularly  intact     Assessment: Encounter Diagnoses  Name Primary?   Neck pain Yes   Torticollis    Acute pain of left shoulder    Decreased ROM of left shoulder    Other specified hypothyroidism       Plan Neck pain, decreased range of motion of neck, shoulder pain: You seem to have some neck tension and spasm today but also likely has some neck arthritis as well.  There is no x-ray on file but we can consider this going forward Use stretching and range of motion exercise daily for your neck and shoulder You can use ibuprofen over-the-counter, 2 tablets once or twice daily as needed for pain You can also use Tylenol and alternate with ibuprofen for pain every 4-6 hours short-term You can use lidocaine topical cream for pain You can use heat for the neck spasm such as hot towel or hot shower I recommend you do some neck and shoulder exercises as we discussed Begin prescription tizanidine muscle relaxer once or twice daily for the next few days for neck tension and spasm.  Caution this can cause drowsiness I will refer you to physical therapy for neck and arm pain and neck spasm  I refilled your thyroid medicine today as you were running out   Kathy Freeman was seen today for neck pain.  Diagnoses and all  orders for this visit:  Neck pain  Torticollis  Acute pain of left shoulder  Decreased ROM of left shoulder  Other specified hypothyroidism -     levothyroxine (SYNTHROID) 100 MCG tablet; Take 1 tablet (100 mcg total) by mouth daily.  Other orders -     diclofenac Sodium (VOLTAREN) 1 % GEL; APPLY 2 GRAMS TOPICALLY TO THE AFFECTED AREA FOUR TIMES DAILY -     tiZANidine (ZANAFLEX) 4 MG tablet; Take 1 tablet (4 mg total) by mouth 2 (two) times daily as needed for muscle spasms.    F/u 3-4 weeks

## 2023-07-25 NOTE — Addendum Note (Signed)
Addended by: Herminio Commons A on: 07/25/2023 02:39 PM   Modules accepted: Orders

## 2023-08-09 DIAGNOSIS — M5136 Other intervertebral disc degeneration, lumbar region: Secondary | ICD-10-CM | POA: Diagnosis not present

## 2023-08-22 DIAGNOSIS — M545 Low back pain, unspecified: Secondary | ICD-10-CM | POA: Diagnosis not present

## 2023-08-24 DIAGNOSIS — L0101 Non-bullous impetigo: Secondary | ICD-10-CM | POA: Diagnosis not present

## 2023-08-24 DIAGNOSIS — L400 Psoriasis vulgaris: Secondary | ICD-10-CM | POA: Diagnosis not present

## 2023-08-25 IMAGING — CR DG HAND COMPLETE 3+V*L*
3 series · 3 of 3 positions shown · non-contrast
Comparison: None.

CLINICAL DATA: Pain

EXAM:
LEFT HAND - COMPLETE 3+ VIEW

[x hand pa left]
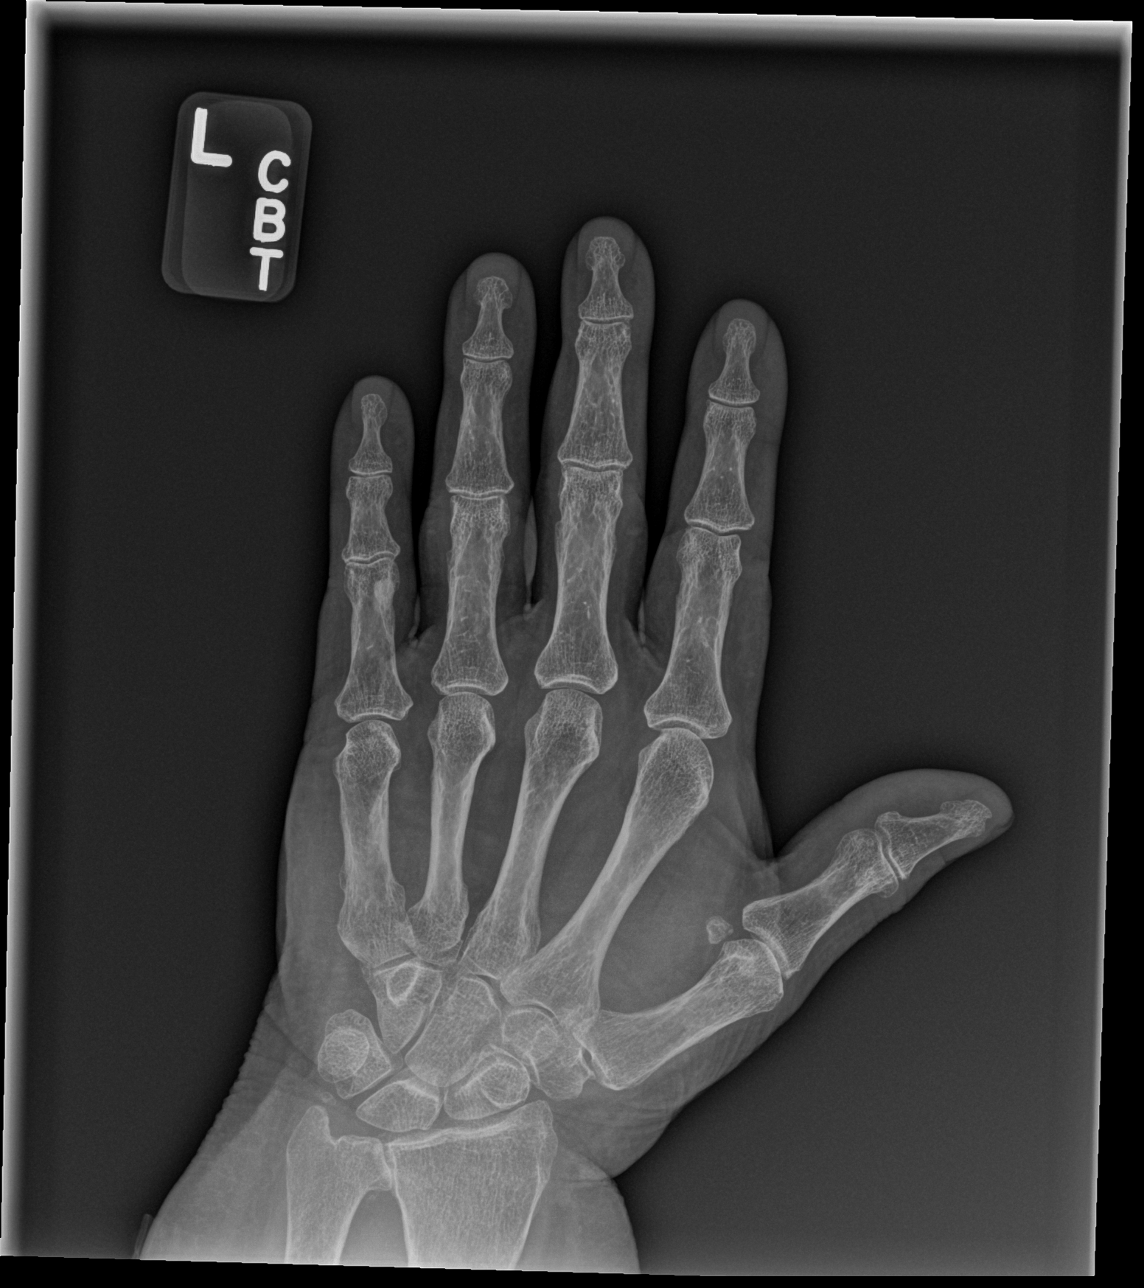

[x hand obl left]
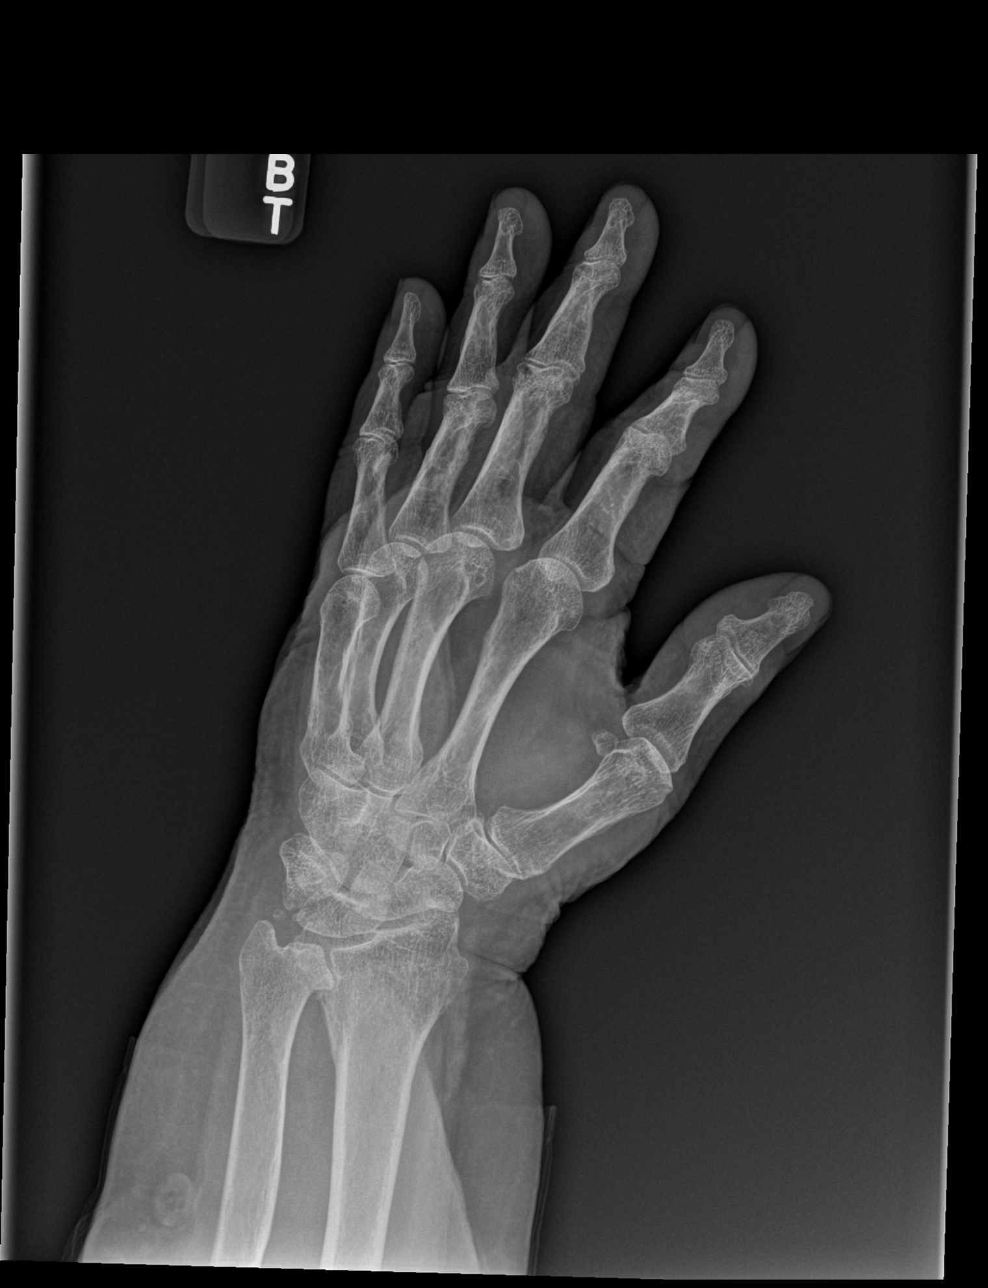

[x hand lat left]
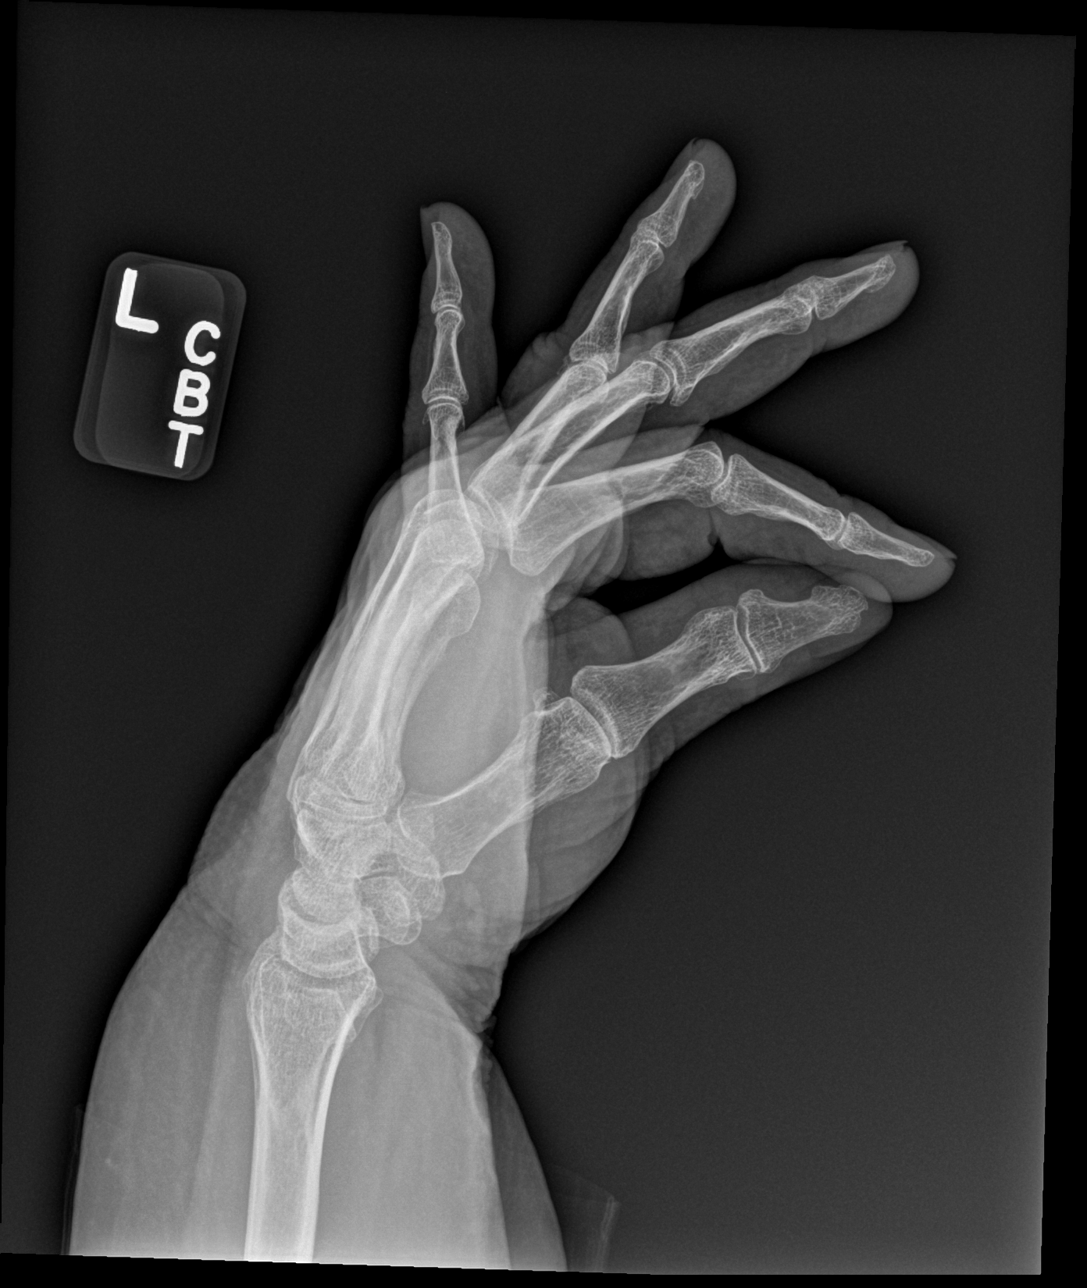

[3 of 3 positions shown; findings below may reference images not displayed]

FINDINGS: No recent fracture or dislocation is seen. There is 2 mm smooth
marginated calcification adjacent to the tip of ulnar styloid
suggesting old avulsion. Small sclerotic density in the distal shaft
of proximal phalanx of fifth finger may suggest benign bone island.
Minimal bony spurs seen in the interphalangeal joint of the left
thumb. No other significant bony spurs seen in the interphalangeal
joints. Bony spurs seen in first carpometacarpal and first
metacarpophalangeal joints. Osteopenia is seen in bony structures.
IMPRESSION: No recent fracture or dislocation is seen.

Small bony spurs seen in multiple joints in the left thumb as
described in the body of the report.

## 2023-08-25 IMAGING — CR DG HAND COMPLETE 3+V*R*
3 series · 3 of 3 positions shown · non-contrast
Comparison: None

CLINICAL DATA: Pain

EXAM:
RIGHT HAND - COMPLETE 3+ VIEW

[x hand pa right]
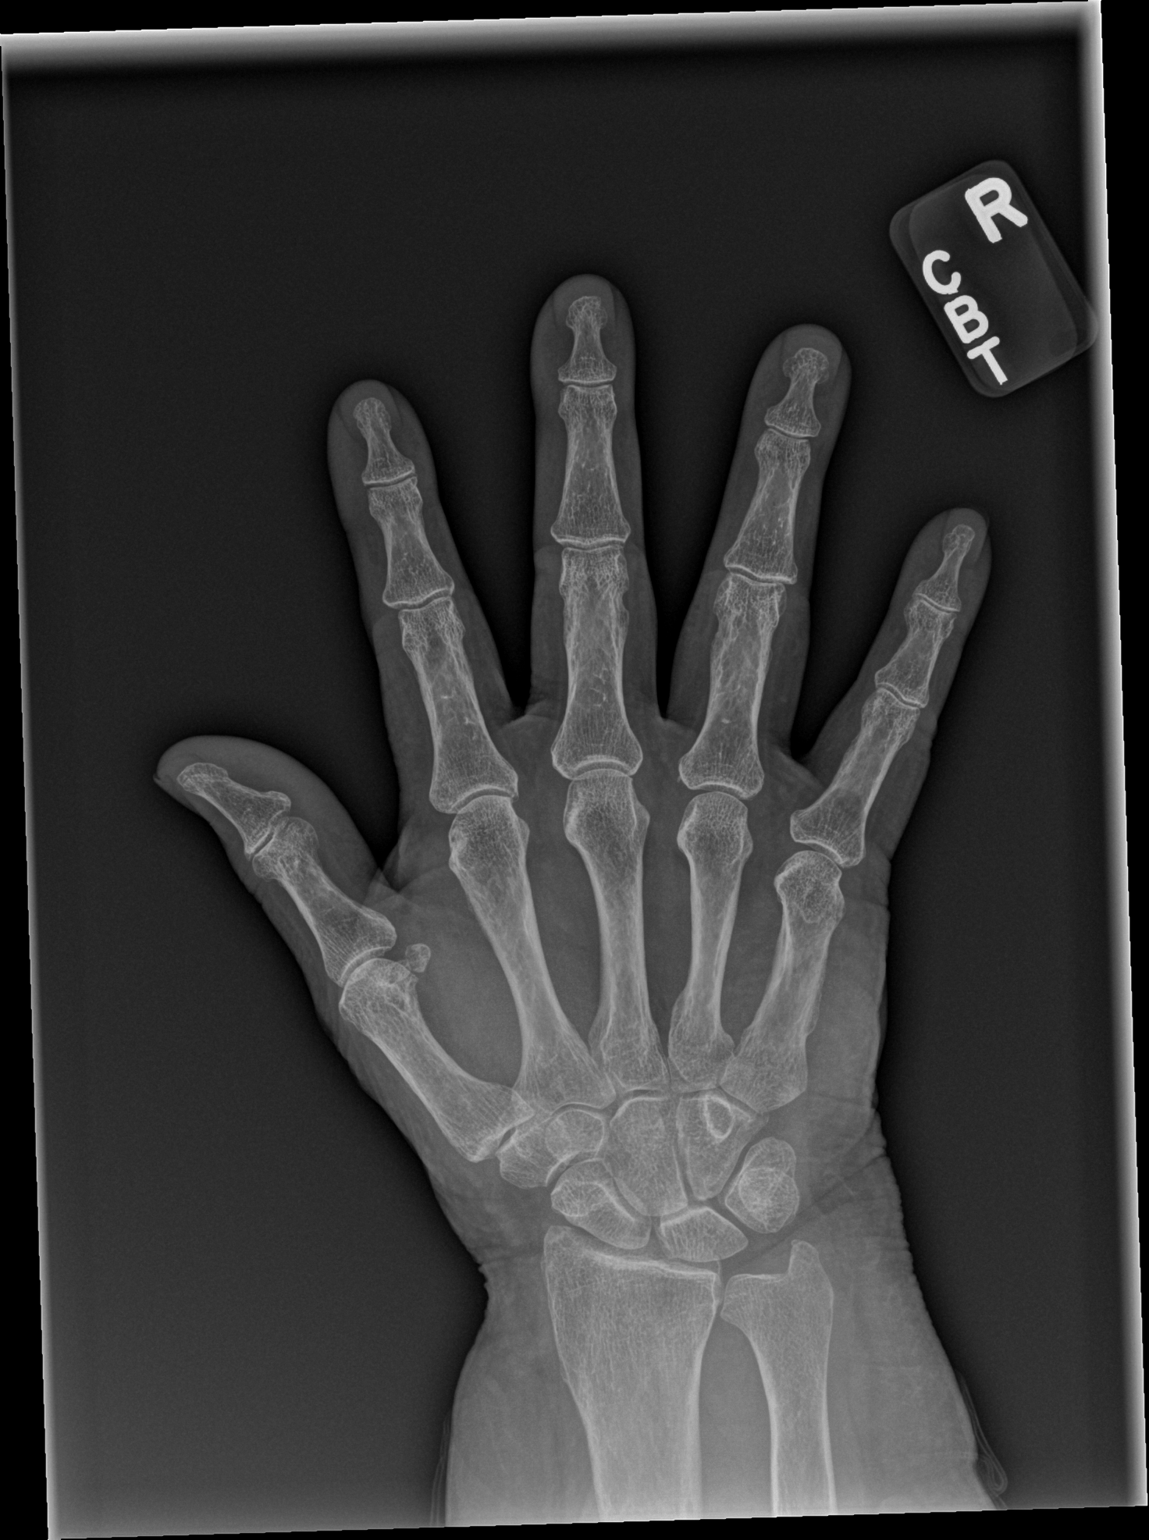

[x hand obl right]
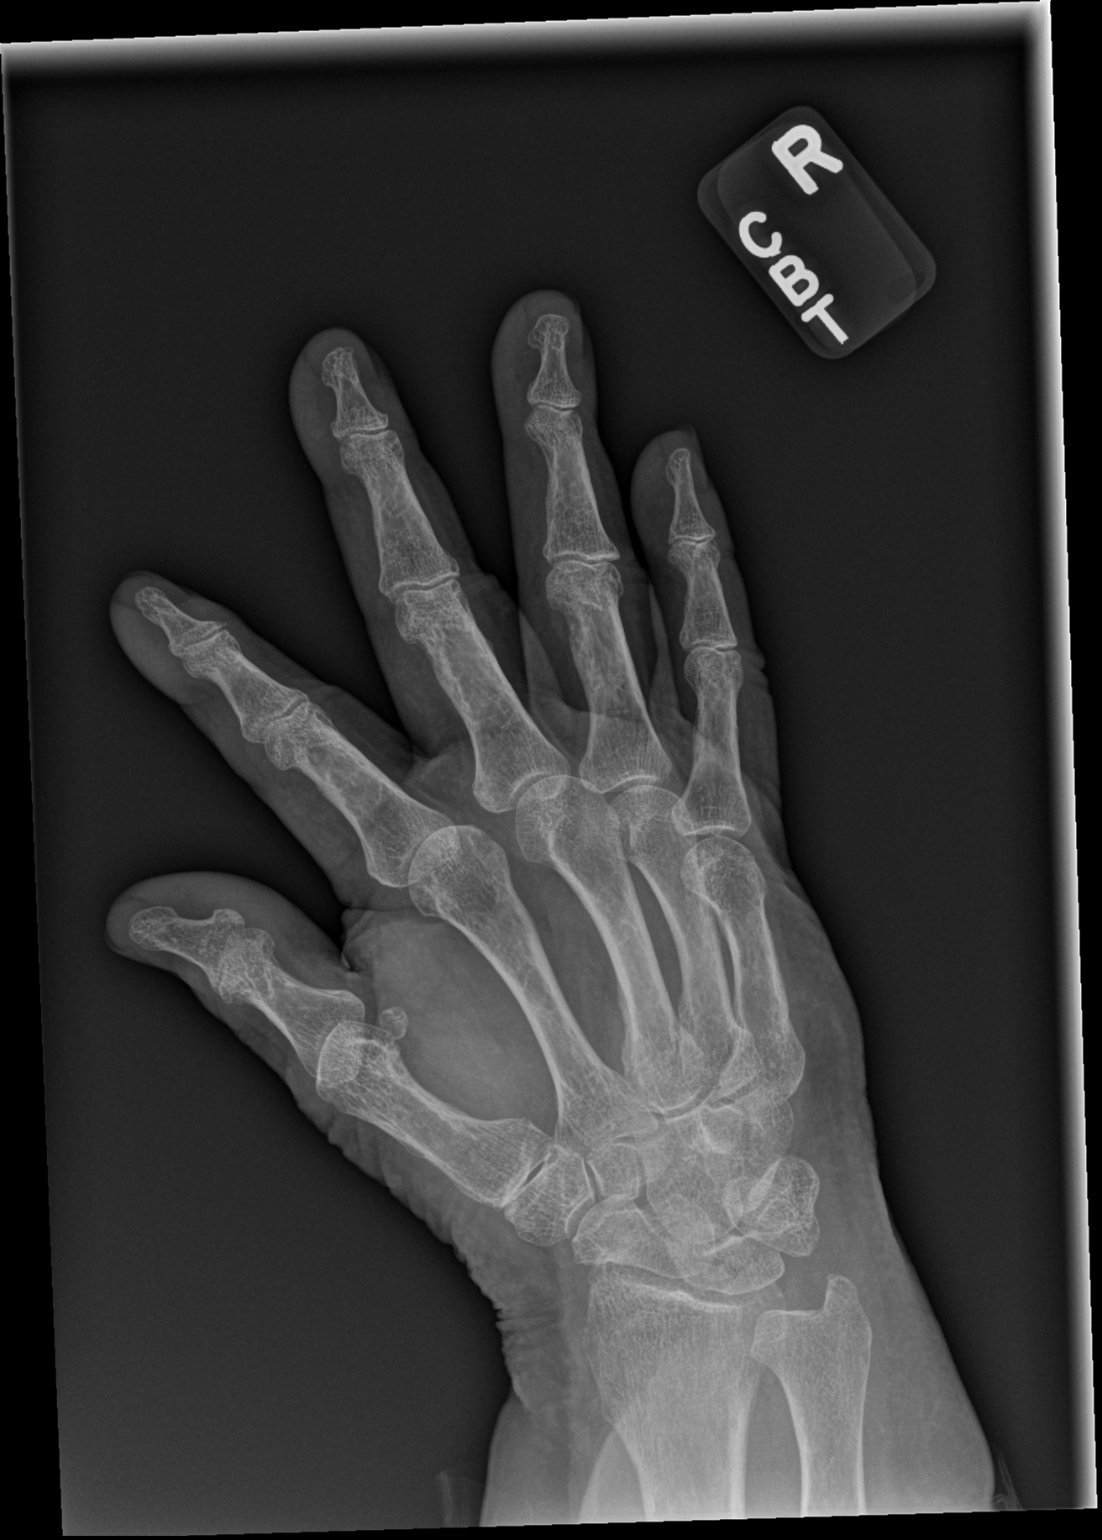

[x hand lat right]
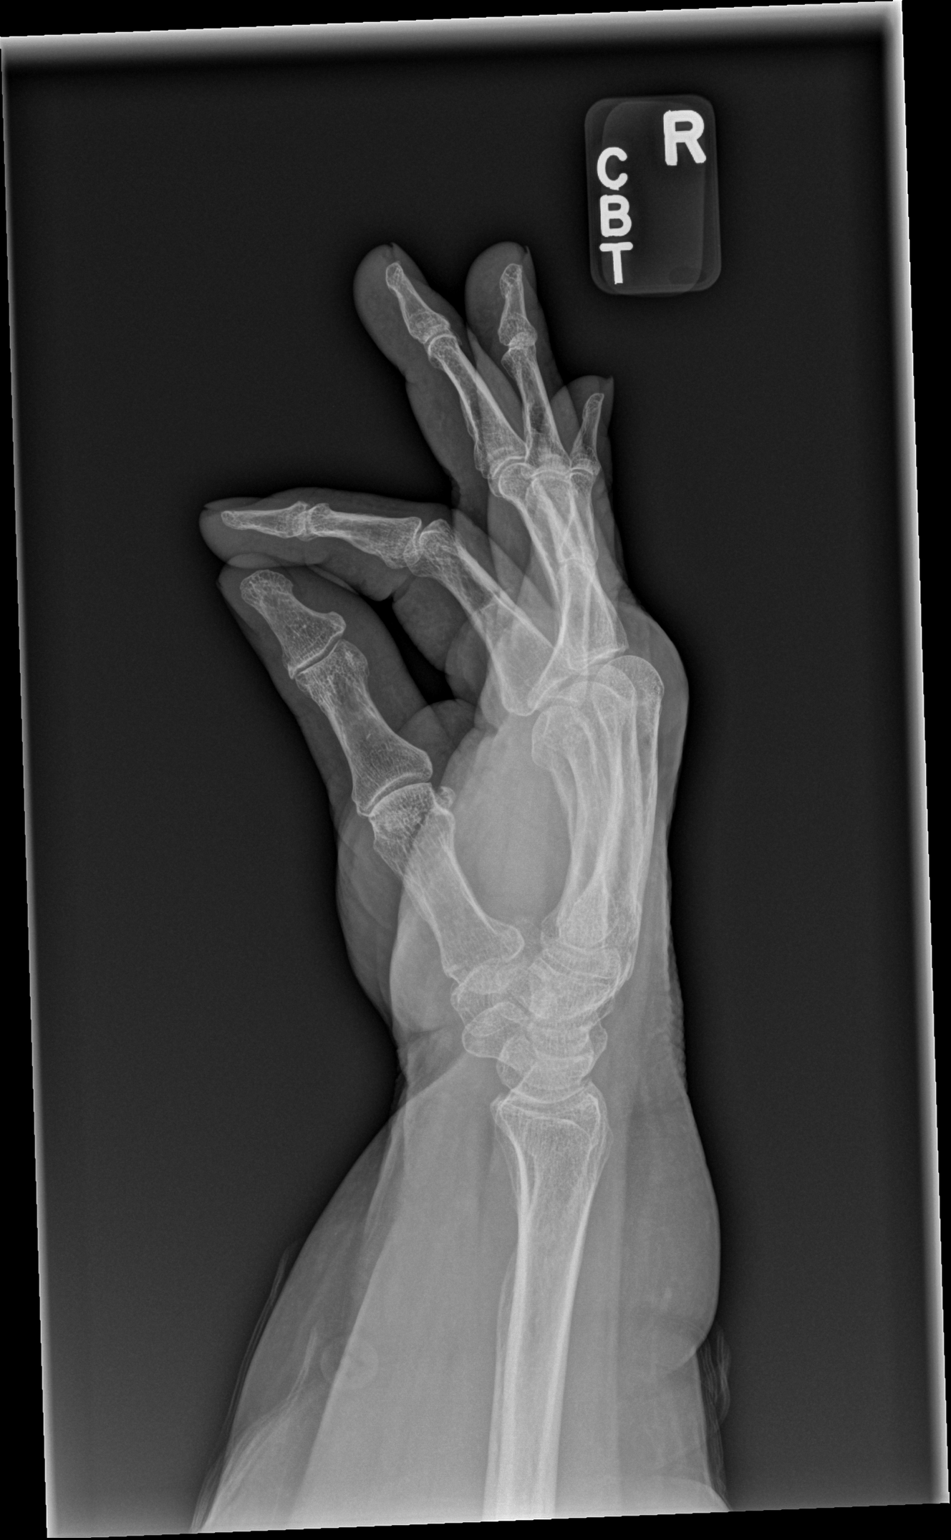

[3 of 3 positions shown; findings below may reference images not displayed]

FINDINGS: No recent fracture or dislocation is seen. Minimal cortical
deformity in the shaft of fifth metacarpal may be normal variation
or residual change from previous injury. Osteopenia is seen in bony
structures. Small bony spurs seen in first carpometacarpal and first
metacarpophalangeal joints. Minimal bony spurs seen in the
interphalangeal joint of the thumb.
IMPRESSION: No recent fracture or dislocation is seen.

Degenerative changes are noted in multiple joints in the right
thumb.

## 2023-08-29 DIAGNOSIS — M51369 Other intervertebral disc degeneration, lumbar region without mention of lumbar back pain or lower extremity pain: Secondary | ICD-10-CM | POA: Diagnosis not present

## 2023-09-12 ENCOUNTER — Ambulatory Visit (INDEPENDENT_AMBULATORY_CARE_PROVIDER_SITE_OTHER): Payer: Medicare Other | Admitting: Medical

## 2023-09-12 VITALS — BP 116/70 | HR 45 | Temp 98.2°F | Wt 214.4 lb

## 2023-09-12 DIAGNOSIS — R21 Rash and other nonspecific skin eruption: Secondary | ICD-10-CM

## 2023-09-12 DIAGNOSIS — L409 Psoriasis, unspecified: Secondary | ICD-10-CM | POA: Diagnosis not present

## 2023-09-12 DIAGNOSIS — R300 Dysuria: Secondary | ICD-10-CM

## 2023-09-12 LAB — POCT URINALYSIS DIP (PROADVANTAGE DEVICE)
Bilirubin, UA: NEGATIVE
Blood, UA: NEGATIVE
Glucose, UA: NEGATIVE mg/dL
Ketones, POC UA: NEGATIVE mg/dL
Leukocytes, UA: NEGATIVE
Nitrite, UA: NEGATIVE
Protein Ur, POC: NEGATIVE mg/dL
Specific Gravity, Urine: 1.015
Urobilinogen, Ur: NEGATIVE
pH, UA: 6 (ref 5.0–8.0)

## 2023-09-12 NOTE — Progress Notes (Signed)
Subjective:  Kathy Freeman is a 82 y.o. female who presents for Chief Complaint  Patient presents with   Urinary Tract Infection    Possible UTI- burning sensation with urination Had MRI October 9th-at [facility] for back issues so since then having issues with rash on stomach, arms, legs     Here for 2 concerns.  She notes an episode of burning sensation with urination recently.  No major symptoms today.  No blood in urine, no odor, no frequency or urgency.  She had an MRI October night with the [facility] for her back.  This was without contrast.  Within 3 days she started getting a rash on both arms, both legs,.  The rash is not typically all that itchy but it is darker red and along the leg.  She has been putting Vaseline on the rash.  Vaseline is not helping though.  She sees dermatology and just saw them recently in August.  They prescribed several different topical treatments but all of them were too expensive.  She is called back twice and so far everything has been sent to the pharmacy was too expensive.  No other aggravating or relieving factors.    No other c/o.  Past Medical History:  Diagnosis Date   Allergy    RHINITIS   Arthritis    Colonic polyp    Hemorrhoids    Obesity    Psoriasis    Raynaud disease    Thyroid disease    HYPOTHYROID   Current Outpatient Medications on File Prior to Visit  Medication Sig Dispense Refill   diclofenac Sodium (VOLTAREN) 1 % GEL APPLY 2 GRAMS TOPICALLY TO THE AFFECTED AREA FOUR TIMES DAILY 100 g 0   ibuprofen (ADVIL) 200 MG tablet Take 400 mg by mouth every 6 (six) hours as needed.     levothyroxine (SYNTHROID) 100 MCG tablet Take 1 tablet (100 mcg total) by mouth daily. 90 tablet 0   loratadine (CLARITIN) 10 MG tablet Take 10 mg by mouth daily.     Multiple Vitamins-Minerals (MULTIVITAMIN WITH MINERALS) tablet Take 1 tablet by mouth daily.     Nutritional Supplements (VITAMIN D BOOSTER PO) Take by mouth.     tiZANidine  (ZANAFLEX) 4 MG tablet Take 1 tablet (4 mg total) by mouth 2 (two) times daily as needed for muscle spasms. 30 tablet 0   traMADol (ULTRAM) 50 MG tablet Take 50 mg by mouth every 6 (six) hours as needed.     No current facility-administered medications on file prior to visit.     The following portions of the patient's history were reviewed and updated as appropriate: allergies, current medications, past family history, past medical history, past social history, past surgical history and problem list.  ROS Otherwise as in subjective above  Objective: BP 116/70   Pulse (!) 45   Temp 98.2 F (36.8 C)   Wt 214 lb 6.4 oz (97.3 kg)   BMI 40.51 kg/m   General appearance: alert, no distress, well developed, well nourished Skin: She has several nonspecific patches of erythema that are slightly raised and similar scaled, including small roundish scaled erythematous plaque on left lateral lower leg, similar bigger 1 cm diameter scaled erythematous plaque on right inner upper thigh, small patches of erythema on right and left forearms but not urticaria, no papules, no pustules, no vesicles Abdomen nontender, no organomegaly   Assessment: Encounter Diagnoses  Name Primary?   Dysuria Yes   Rash    Psoriasis  Plan Dysuria-urinalysis unremarkable today not significant for UTI.  She was unable to leave enough urine for culture though.  She will return tomorrow for fresh urine sample if she has any more symptoms today or tomorrow morning  Rash, psoriasis-I suspect her rash is nothing to do with her recent MRI but is a flareup of psoriasis.  I contacted dermatology directly and told them about her issue with her medications being too expensive.  They will reach out to her to get her new options that will hopefully be less expensive to help treat her psoriasis rash.    No new medications from [place] today as that would only confuse the matter.  Kathy Freeman was seen today for urinary tract  infection.  Diagnoses and all orders for this visit:  Dysuria -     POCT Urinalysis DIP (Proadvantage Device)  Rash  Psoriasis    Follow up: with dermatology

## 2023-09-17 ENCOUNTER — Telehealth: Payer: Self-pay | Admitting: Family Medicine

## 2023-09-17 DIAGNOSIS — R300 Dysuria: Secondary | ICD-10-CM

## 2023-09-17 NOTE — Telephone Encounter (Signed)
ordered

## 2023-09-17 NOTE — Telephone Encounter (Signed)
Pt said you told her to drop by one morning for a urine, she is coming by Wednesday , No orders in system

## 2023-09-19 ENCOUNTER — Other Ambulatory Visit: Payer: Medicare Other

## 2023-10-03 ENCOUNTER — Ambulatory Visit (INDEPENDENT_AMBULATORY_CARE_PROVIDER_SITE_OTHER): Payer: Medicare Other | Admitting: Medical

## 2023-10-03 VITALS — BP 120/80 | HR 85 | Temp 98.5°F | Wt 212.8 lb

## 2023-10-03 DIAGNOSIS — L304 Erythema intertrigo: Secondary | ICD-10-CM

## 2023-10-03 DIAGNOSIS — L409 Psoriasis, unspecified: Secondary | ICD-10-CM | POA: Diagnosis not present

## 2023-10-03 DIAGNOSIS — R300 Dysuria: Secondary | ICD-10-CM

## 2023-10-03 DIAGNOSIS — B354 Tinea corporis: Secondary | ICD-10-CM | POA: Diagnosis not present

## 2023-10-03 DIAGNOSIS — R350 Frequency of micturition: Secondary | ICD-10-CM | POA: Diagnosis not present

## 2023-10-03 LAB — POCT URINALYSIS DIP (PROADVANTAGE DEVICE)
Bilirubin, UA: NEGATIVE
Blood, UA: NEGATIVE
Glucose, UA: NEGATIVE mg/dL
Ketones, POC UA: NEGATIVE mg/dL
Nitrite, UA: NEGATIVE
Protein Ur, POC: NEGATIVE mg/dL
Specific Gravity, Urine: 1.01
Urobilinogen, Ur: NEGATIVE
pH, UA: 6 (ref 5.0–8.0)

## 2023-10-03 MED ORDER — FLUCONAZOLE 100 MG PO TABS: 100.0000 mg | ORAL_TABLET | Freq: Every day | ORAL | 0 refills | Status: DC

## 2023-10-03 MED ORDER — NYSTATIN 100000 UNIT/GM EX POWD: 1.0000 | Freq: Two times a day (BID) | CUTANEOUS | 1 refills | Status: DC

## 2023-10-03 MED ORDER — BETAMETHASONE VALERATE 0.1 % EX OINT: 1.0000 | TOPICAL_OINTMENT | Freq: Two times a day (BID) | CUTANEOUS | 0 refills | Status: DC

## 2023-10-03 MED ORDER — CLOTRIMAZOLE-BETAMETHASONE 1-0.05 % EX CREA: 1.0000 | TOPICAL_CREAM | Freq: Every day | CUTANEOUS | 0 refills | Status: DC

## 2023-10-03 MED ORDER — OTEZLA 10 & 20 & 30 MG PO TBPK: 1.0000 | ORAL_TABLET | Freq: Every day | ORAL | 0 refills | Status: DC

## 2023-10-03 NOTE — Progress Notes (Signed)
Subjective:  Kathy Freeman is a 82 y.o. female who presents for Chief Complaint  Patient presents with   Rash    Rash on arms and legs and hands, under breast, severe rash near her groin area and burns. Sometimes when the urine hits her legs it will burn.      Here for recheck  I saw her October 30 for dysuria and other symptoms, rash.  She notes that she came back in on October 31 to do a urine specimen but apparently the result never got to me  She notes some improvements but still has some frequency of urine  She has ongoing concerns about rash.  She has underlying psoriasis and has a lot of psoriasis rash in general.  Last visit we had called to dermatology since her creams prescribed by dermatology were too expensive.  She was supposed to have communication follow-up with them.  She notes that they called her phone and she missed the phone call but has not had a chance to call them back.  Thus,she still has not gotten any new creams for psoriasis  She has some other rash though she is concerned about including upper thighs, under the breast, some other new rash on the arms and legs, itchy at times, burns at times.  Using some Aquaphor on the rash.  No other aggravating or relieving factors.    No other c/o.  The following portions of the patient's history were reviewed and updated as appropriate: allergies, current medications, past family history, past medical history, past social history, past surgical history and problem list.  ROS Otherwise as in subjective above    Objective: BP 120/80   Pulse 85   Temp 98.5 F (36.9 C)   Wt 212 lb 12.8 oz (96.5 kg)   BMI 40.21 kg/m   General appearance: alert, no distress, well developed, well nourished She has large contiguous red irritated rash on the inner upper thighs and similar rash underneath both breast worse on the right and similar rash on the left upper arm medially suggestive of fungal rash.  She has another round 2 to  3 cm diameter either red or scaly lesions on anterior lower legs, right thigh a larger lesion that is scaly and some similar roundish lesions on the arms more suggestive of psoriasis Exam chaperoned by nurse   Assessment: Encounter Diagnoses  Name Primary?   Psoriasis Yes   Tinea corporis    Intertrigo    Urinary frequency    Dysuria      Plan: Discussed symptoms, concerns, treatment recommendations as below.  Patient Instructions  Recommendations:  Fungus rash under the breast and in between the upper thighs Begin Diflucan oral medication 1 tablet daily for 7 days Begin Lotrisone cream daily to these areas specifically You can use nystatin powder once or twice a day to the same areas under the breast and inner upper thighs Do not use these on the face  Psoriasis rash, round spots on your legs, arms, lower abdomen You can use betamethasone/Valisone ointment once or twice a day short-term for up to 5 days at a time for rash flareup Do not use the betamethasone ointment every single day for weeks and weeks.  This is meant for flareup only Once the rash is calm down then use a daily moisturizing lotion such as Aquaphor or Lubriderm or Cetaphil for general skin care I would like to also try you on an oral medicine called Otezla.  This medicine  is for psoriasis.  Let me know if you have any side effects right away or if it is not covered by insurance I would like you to call back within 3 to 4 weeks to let me know whether skin issues are improving   If any of the medicines are too expensive or not covered by insurance let me know right away   Kathy Freeman was seen today for rash.  Diagnoses and all orders for this visit:  Psoriasis  Tinea corporis  Intertrigo  Urinary frequency -     Urine Culture  Dysuria -     POCT Urinalysis DIP (Proadvantage Device) -     Urine Culture  Other orders -     fluconazole (DIFLUCAN) 100 MG tablet; Take 1 tablet (100 mg total) by mouth  daily. -     nystatin (MYCOSTATIN/NYSTOP) powder; Apply 1 Application topically 2 (two) times daily. -     betamethasone valerate ointment (VALISONE) 0.1 %; Apply 1 Application topically 2 (two) times daily. PSORIASIS -     clotrimazole-betamethasone (LOTRISONE) cream; Apply 1 Application topically daily. FUNGUS -     Apremilast (OTEZLA) 10 & 20 & 30 MG TBPK; Take 1 tablet by mouth daily.    Follow up: call report in 3 weeks

## 2023-10-03 NOTE — Patient Instructions (Addendum)
Recommendations:  Fungus rash under the breast and in between the upper thighs Begin Diflucan oral medication 1 tablet daily for 7 days Begin Lotrisone cream daily to these areas specifically You can use nystatin powder once or twice a day to the same areas under the breast and inner upper thighs Do not use these on the face  Psoriasis rash, round spots on your legs, arms, lower abdomen You can use betamethasone/Valisone ointment once or twice a day short-term for up to 5 days at a time for rash flareup Do not use the betamethasone ointment every single day for weeks and weeks.  This is meant for flareup only Once the rash is calm down then use a daily moisturizing lotion such as Aquaphor or Lubriderm or Cetaphil for general skin care I would like to also try you on an oral medicine called Otezla.  This medicine is for psoriasis.  Let me know if you have any side effects right away or if it is not covered by insurance I would like you to call back within 3 to 4 weeks to let me know whether skin issues are improving   If any of the medicines are too expensive or not covered by insurance let me know right away

## 2023-10-07 LAB — URINE CULTURE

## 2023-10-08 ENCOUNTER — Other Ambulatory Visit: Payer: Self-pay | Admitting: Medical

## 2023-10-08 MED ORDER — CEPHALEXIN 500 MG PO CAPS: 500.0000 mg | ORAL_CAPSULE | Freq: Two times a day (BID) | ORAL | 0 refills | Status: DC

## 2023-10-08 NOTE — Progress Notes (Signed)
Urine positive for infection.  Please start Keflex antibiotic twice a day for a week for urinary tract infection  Was she able to get the medications prescribed last visit including the oral Otezla for psoriasis?  Was any medication not covered or too expensive?

## 2023-10-19 ENCOUNTER — Other Ambulatory Visit: Payer: Self-pay | Admitting: Medical

## 2023-10-19 DIAGNOSIS — E038 Other specified hypothyroidism: Secondary | ICD-10-CM

## 2023-10-25 ENCOUNTER — Telehealth: Payer: Self-pay

## 2023-10-25 ENCOUNTER — Other Ambulatory Visit (HOSPITAL_COMMUNITY): Payer: Self-pay

## 2023-10-25 ENCOUNTER — Telehealth: Payer: Self-pay | Admitting: Internal Medicine

## 2023-10-25 ENCOUNTER — Ambulatory Visit: Payer: Medicare Other | Admitting: Medical

## 2023-10-25 VITALS — BP 124/82 | HR 64 | Temp 98.1°F | Wt 208.8 lb

## 2023-10-25 DIAGNOSIS — R8290 Unspecified abnormal findings in urine: Secondary | ICD-10-CM

## 2023-10-25 DIAGNOSIS — R300 Dysuria: Secondary | ICD-10-CM

## 2023-10-25 DIAGNOSIS — L409 Psoriasis, unspecified: Secondary | ICD-10-CM

## 2023-10-25 DIAGNOSIS — L304 Erythema intertrigo: Secondary | ICD-10-CM

## 2023-10-25 LAB — POCT URINALYSIS DIP (PROADVANTAGE DEVICE)
Bilirubin, UA: NEGATIVE
Blood, UA: NEGATIVE
Glucose, UA: NEGATIVE mg/dL
Ketones, POC UA: NEGATIVE mg/dL
Leukocytes, UA: NEGATIVE
Nitrite, UA: NEGATIVE
Protein Ur, POC: NEGATIVE mg/dL
Specific Gravity, Urine: 1.01
Urobilinogen, Ur: NEGATIVE
pH, UA: 6 (ref 5.0–8.0)

## 2023-10-25 MED ORDER — BETAMETHASONE VALERATE 0.1 % EX OINT: 1.0000 | TOPICAL_OINTMENT | Freq: Two times a day (BID) | CUTANEOUS | 0 refills | Status: DC

## 2023-10-25 MED ORDER — TIZANIDINE HCL 4 MG PO TABS
4.0000 mg | ORAL_TABLET | Freq: Two times a day (BID) | ORAL | 0 refills | Status: DC | PRN
Start: 2023-10-25 — End: 2024-01-31

## 2023-10-25 MED ORDER — OTEZLA 10 & 20 & 30 MG PO TBPK: 1.0000 | ORAL_TABLET | Freq: Every day | ORAL | 0 refills | Status: DC

## 2023-10-25 MED ORDER — CLOTRIMAZOLE-BETAMETHASONE 1-0.05 % EX CREA: 1.0000 | TOPICAL_CREAM | Freq: Every day | CUTANEOUS | 0 refills | Status: AC

## 2023-10-25 NOTE — Telephone Encounter (Signed)
Per Kristian Covey, PA-C, Please check with pharm team about prior auth on Otezla.  I'd like her to start this for psoriasis.

## 2023-10-25 NOTE — Progress Notes (Addendum)
Subjective:  Kathy Freeman is a 82 y.o. female who presents for Chief Complaint  Patient presents with   Rash    Rash on legs have gotten worse and still on arms. Breast rash has cleared up.     Here for recheck on psoriasis, intertrigo and urinary tract infection  Last visit 10/03/2023 for those symptoms.  She is using the Aquaphor daily lotion.  She is not sure if she is using the right stuff prescribed last time although Valisone betamethasone ointment was prescribed for psoriasis spots.  She was not able to get the [place] oral medication yet as it required prior authorization  She is using nystatin powder and the Lotrisone cream for the spots in the inner upper thighs and under the breast.  That has improved.   she also completed a round of Diflucan oral  She completed the Keflex oral antibiotic for urinary tract infection.  She has no major symptoms but does have some irritation with urine  No other aggravating or relieving factors.    No other c/o.   The following portions of the patient's history were reviewed and updated as appropriate: allergies, current medications, past family history, past medical history, past social history, past surgical history and problem list.  ROS Otherwise as in subjective above    Objective: BP 124/82   Pulse 64   Temp 98.1 F (36.7 C)   Wt 208 lb 12.8 oz (94.7 kg)   BMI 39.45 kg/m   General appearance: alert, no distress, well developed, well nourished She has several  2 to 3 cm diameter either red or scaly lesions on anterior lower legs, right thigh a larger lesion that is scaly and some similar roundish lesions on the arms consistent with psoriasis    Assessment: Encounter Diagnoses  Name Primary?   Psoriasis Yes   Intertrigo    Dysuria    Abnormal urinalysis       Plan: Discussed symptoms, concerns, treatment recommendations as below.  For irritated rash under breasts or in the upper inner thighs you can continue  Nystatin powder once or twice a day.  When you have worse itching or redness use the Lotrisone cream twice a day for up to a week at a time.  This cream is in a red and white box from the pharmacy   For psoriasis, your insurance is requiring paperwork before they will approve Otezla.  I would like you to begin Otezla oral medication to help with your psoriasis.  Let me work on Clinical biochemist for this.  In the meantime you can use the Valisone/betamethasone ointment once or twice a day up to 5 days at a time for the red round spots.  This prescription steroid ointment came in a yellow and white box  Continue Aquaphor daily over-the-counter moisturizing lotion on your skin in general  I recommend you schedule a follow-up with dermatology  Last visit you had a urinary tract infection and you completed the antibiotic.  Your urine looks fine today.    Patient Instructions   For irritated rash under breasts or in the upper inner thighs you can continue Nystatin powder once or twice a day.  When you have worse itching or redness use the Lotrisone cream twice a day for up to a week at a time.  This cream is in a red and white box from the pharmacy   For psoriasis, your insurance is requiring paperwork before they will approve Otezla.  I would  like you to begin [place] oral medication to help with your psoriasis.  Let me work on Clinical biochemist for this.  In the meantime you can use the Valisone/betamethasone ointment once or twice a day up to 5 days at a time for the red round spots.  This prescription steroid ointment came in a yellow and white box  Continue Aquaphor daily over-the-counter moisturizing lotion on your skin in general  I recommend you schedule a follow-up with dermatology  Last visit you had a urinary tract infection and you completed the antibiotic.  Your urine looks fine today.   Kathy Freeman was seen today for rash.  Diagnoses and all orders for this  visit:  Psoriasis  Intertrigo  Dysuria  Abnormal urinalysis  Other orders -     Apremilast (OTEZLA) 10 & 20 & 30 MG TBPK; Take 1 tablet by mouth daily. -     betamethasone valerate ointment (VALISONE) 0.1 %; Apply 1 Application topically 2 (two) times daily. PSORIASIS -     clotrimazole-betamethasone (LOTRISONE) cream; Apply 1 Application topically daily. FUNGUS -     tiZANidine (ZANAFLEX) 4 MG tablet; Take 1 tablet (4 mg total) by mouth 2 (two) times daily as needed for muscle spasms.    Follow up: with dermatology

## 2023-10-25 NOTE — Progress Notes (Signed)
Faxed over to [facility] dermatology

## 2023-10-25 NOTE — Patient Instructions (Signed)
  For irritated rash under breasts or in the upper inner thighs you can continue Nystatin powder once or twice a day.  When you have worse itching or redness use the Lotrisone cream twice a day for up to a week at a time.  This cream is in a red and white box from the pharmacy   For psoriasis, your insurance is requiring paperwork before they will approve Otezla.  I would like you to begin Otezla oral medication to help with your psoriasis.  Let me work on Clinical biochemist for this.  In the meantime you can use the Valisone/betamethasone ointment once or twice a day up to 5 days at a time for the red round spots.  This prescription steroid ointment came in a yellow and white box  Continue Aquaphor daily over-the-counter moisturizing lotion on your skin in general  I recommend you schedule a follow-up with dermatology  Last visit you had a urinary tract infection and you completed the antibiotic.  Your urine looks fine today.

## 2023-10-25 NOTE — Telephone Encounter (Signed)
Pharmacy Patient Advocate Encounter   Received notification from Physician's Office that prior authorization for Kathy Freeman is required/requested.   Insurance verification completed.   The patient is insured through [facility] .   Per test claim: PA required; PA submitted to above mentioned insurance via CoverMyMeds Key/confirmation #/EOC Key: [id number])             Status is pending

## 2023-10-25 NOTE — Progress Notes (Signed)
Sent a message to them

## 2023-10-25 NOTE — Addendum Note (Signed)
Addended by: Jac Canavan on: 10/25/2023 10:45 AM   Modules accepted: Orders

## 2023-10-25 NOTE — Addendum Note (Signed)
Addended by: Herminio Commons A on: 10/25/2023 10:49 AM   Modules accepted: Orders

## 2023-10-26 NOTE — Telephone Encounter (Signed)
Pharmacy Patient Advocate Encounter  Received notification from [employer] that Prior Authorization for [place]  has been DENIED.  Full denial letter will be uploaded to the media tab. See denial reason below.       PA #/Case ID/Reference #: Key: [id number]

## 2023-10-29 NOTE — Telephone Encounter (Signed)
Left message for pt to call me back 

## 2023-10-30 NOTE — Telephone Encounter (Signed)
Pt was notified and has an appt with dermatology on Thursday and will ask her about it

## 2023-10-30 NOTE — Telephone Encounter (Signed)
Left message for pt to call me back 

## 2023-11-01 DIAGNOSIS — L400 Psoriasis vulgaris: Secondary | ICD-10-CM | POA: Diagnosis not present

## 2023-11-01 DIAGNOSIS — L0101 Non-bullous impetigo: Secondary | ICD-10-CM | POA: Diagnosis not present

## 2023-11-01 DIAGNOSIS — B354 Tinea corporis: Secondary | ICD-10-CM | POA: Diagnosis not present

## 2023-11-01 DIAGNOSIS — B353 Tinea pedis: Secondary | ICD-10-CM | POA: Diagnosis not present

## 2023-11-03 IMAGING — CR DG HIP (WITH OR WITHOUT PELVIS) 2-3V*L*
3 series · 3 of 3 positions shown · non-contrast
Comparison: None Available.

CLINICAL DATA: Left hip pain for 2 months.  No known injury.

EXAM:
DG HIP (WITH OR WITHOUT PELVIS) 2-3V LEFT

[w pelvis upright]
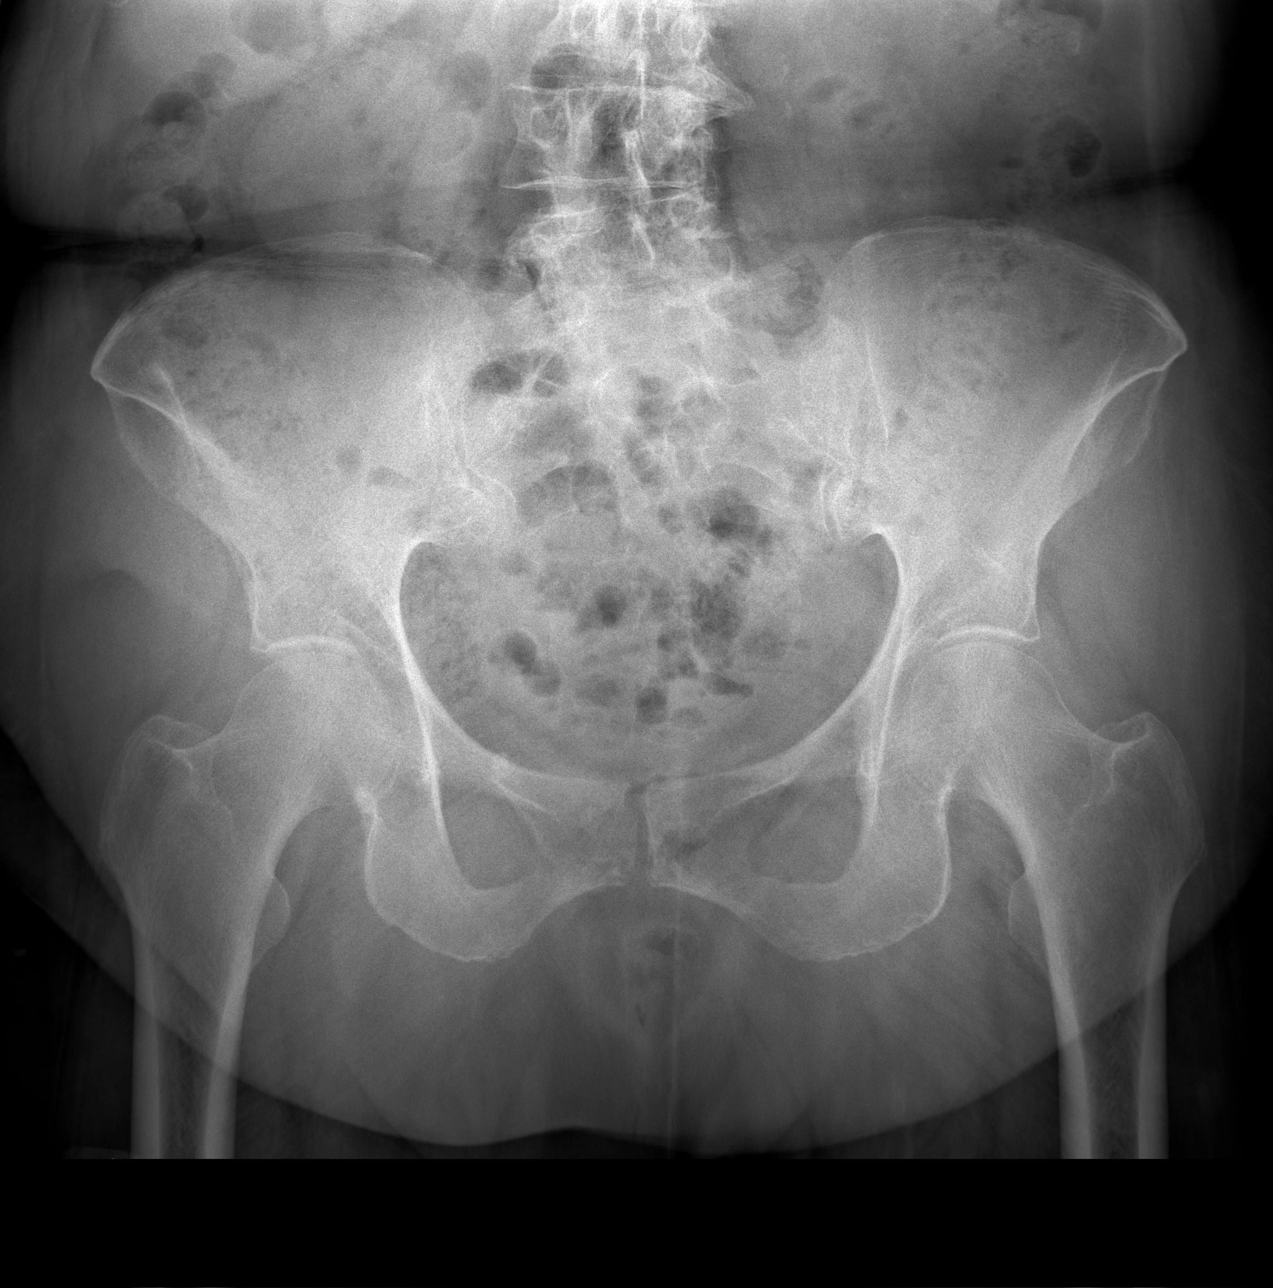

[w hip ap left]
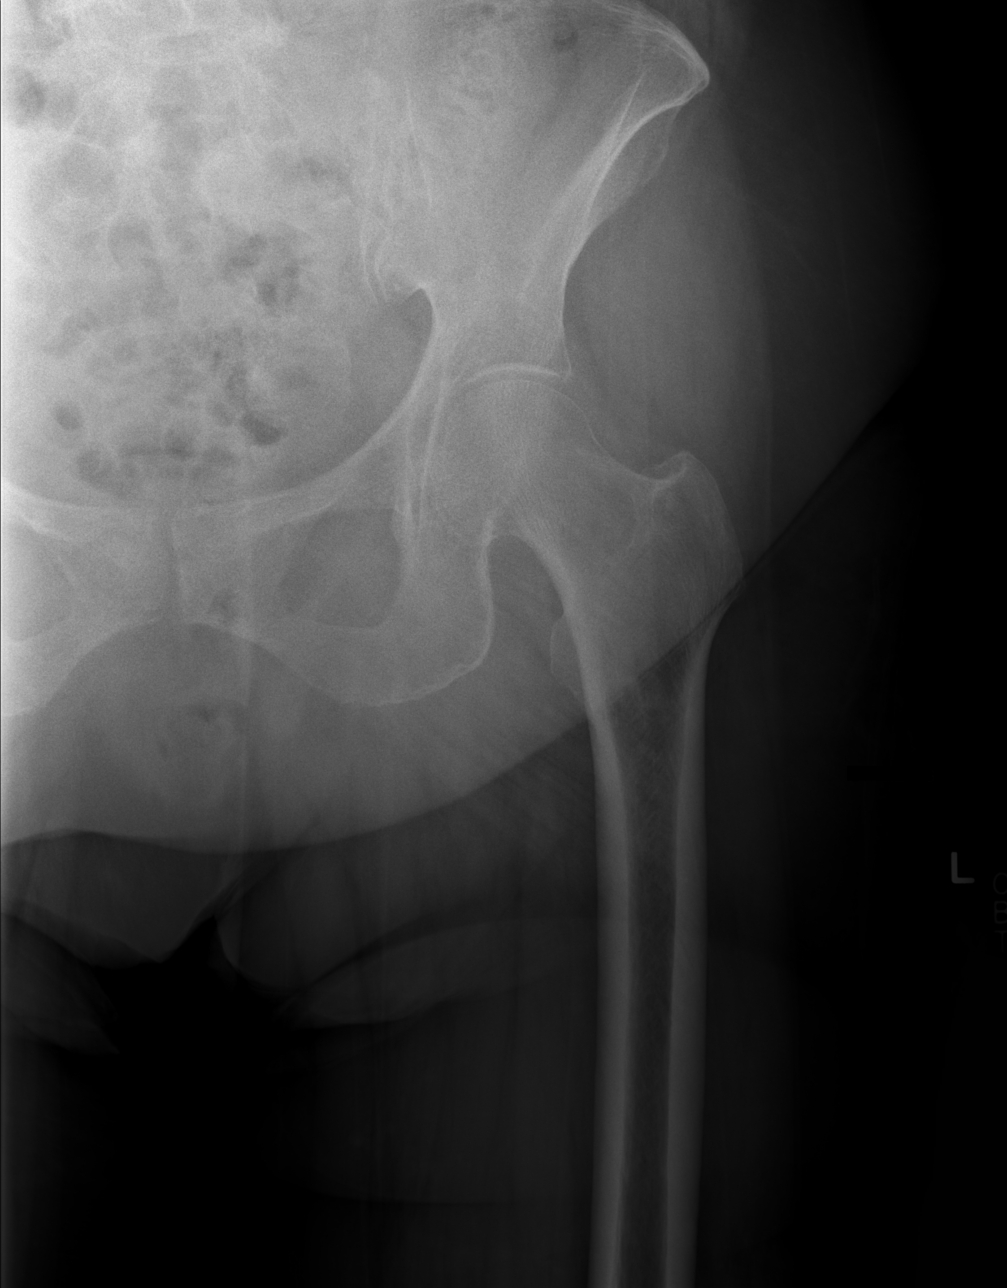

[w hip lat left]
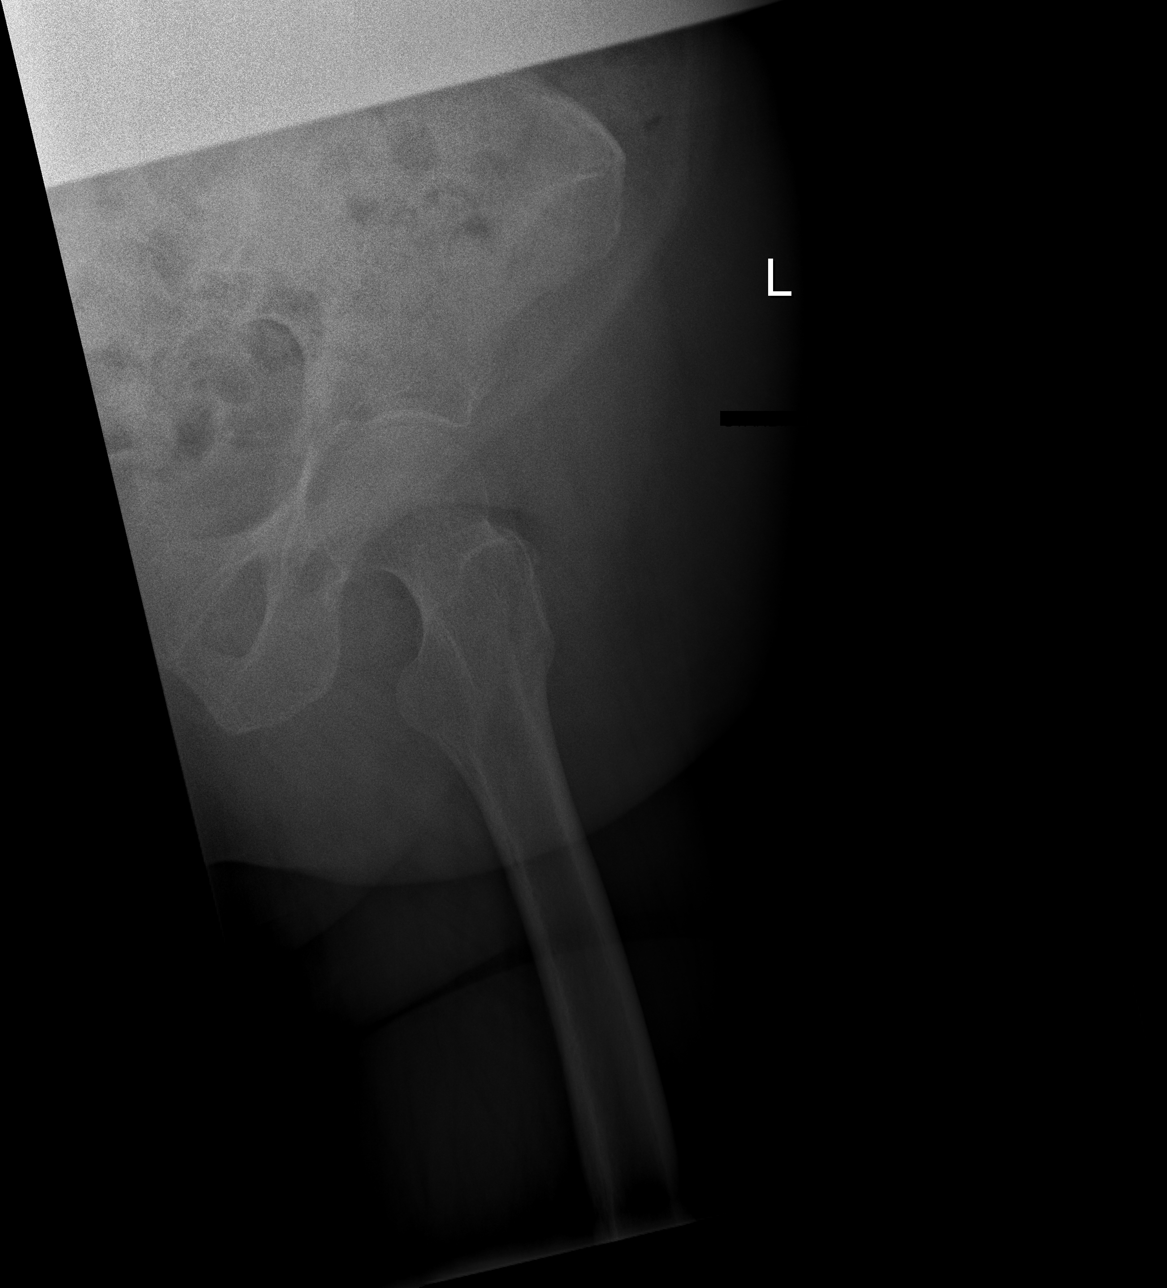

[3 of 3 positions shown; findings below may reference images not displayed]

FINDINGS: Mild-to-moderate bilateral femoroacetabular joint space narrowing.
Mild bilateral sacroiliac subchondral sclerosis. Minimal pubic
symphysis joint space narrowing. Normal morphology of the left
femoral head-neck junction on dedicated frontal and lateral views,
without CAM-type bump deformity.

Moderate to severe left L3-4 and L4-5 disc space narrowing. Large
left L3-4 endplate osteophytes.
IMPRESSION: Mild bilateral femoroacetabular osteoarthritis.

## 2023-11-22 ENCOUNTER — Telehealth: Payer: Self-pay | Admitting: Internal Medicine

## 2023-11-22 NOTE — Telephone Encounter (Signed)
 Pt is overdue for her physical. Left message for pt to call the office to schedule with Dr. Susann Givens

## 2023-12-07 DIAGNOSIS — L0101 Non-bullous impetigo: Secondary | ICD-10-CM | POA: Diagnosis not present

## 2023-12-07 DIAGNOSIS — L400 Psoriasis vulgaris: Secondary | ICD-10-CM | POA: Diagnosis not present

## 2024-01-01 DIAGNOSIS — M4186 Other forms of scoliosis, lumbar region: Secondary | ICD-10-CM | POA: Diagnosis not present

## 2024-01-01 DIAGNOSIS — Z5181 Encounter for therapeutic drug level monitoring: Secondary | ICD-10-CM | POA: Diagnosis not present

## 2024-01-01 DIAGNOSIS — M51369 Other intervertebral disc degeneration, lumbar region without mention of lumbar back pain or lower extremity pain: Secondary | ICD-10-CM | POA: Diagnosis not present

## 2024-01-01 DIAGNOSIS — M48062 Spinal stenosis, lumbar region with neurogenic claudication: Secondary | ICD-10-CM | POA: Diagnosis not present

## 2024-01-01 DIAGNOSIS — Z79899 Other long term (current) drug therapy: Secondary | ICD-10-CM | POA: Diagnosis not present

## 2024-01-08 ENCOUNTER — Encounter: Payer: Self-pay | Admitting: Internal Medicine

## 2024-01-19 ENCOUNTER — Other Ambulatory Visit: Payer: Self-pay | Admitting: Family Medicine

## 2024-01-19 DIAGNOSIS — E038 Other specified hypothyroidism: Secondary | ICD-10-CM

## 2024-01-30 NOTE — Progress Notes (Unsigned)
 No chief complaint on file.    Last UTI was treated with Keflex in 09/2023 by Vincenza Hews. E.coli, sensitive to all (intermediate to amp).   PMH, PSH, SH reviewed   ROS:    PHYSICAL EXAM:  There were no vitals taken for this visit.      ASSESSMENT/PLAN:

## 2024-01-31 ENCOUNTER — Ambulatory Visit: Admitting: Family Medicine

## 2024-01-31 ENCOUNTER — Encounter: Payer: Self-pay | Admitting: Family Medicine

## 2024-01-31 VITALS — BP 124/74 | HR 84 | Temp 99.6°F | Ht 63.0 in | Wt 204.4 lb

## 2024-01-31 DIAGNOSIS — L409 Psoriasis, unspecified: Secondary | ICD-10-CM

## 2024-01-31 DIAGNOSIS — N3000 Acute cystitis without hematuria: Secondary | ICD-10-CM

## 2024-01-31 DIAGNOSIS — R300 Dysuria: Secondary | ICD-10-CM

## 2024-01-31 LAB — POCT URINALYSIS DIP (PROADVANTAGE DEVICE)
Bilirubin, UA: NEGATIVE
Blood, UA: NEGATIVE
Glucose, UA: NEGATIVE mg/dL
Ketones, POC UA: NEGATIVE mg/dL
Nitrite, UA: POSITIVE — AB
Protein Ur, POC: NEGATIVE mg/dL
Specific Gravity, Urine: 1.015
Urobilinogen, Ur: 0.2
pH, UA: 6 (ref 5.0–8.0)

## 2024-01-31 MED ORDER — NITROFURANTOIN MONOHYD MACRO 100 MG PO CAPS: 100.0000 mg | ORAL_CAPSULE | Freq: Two times a day (BID) | ORAL | 0 refills | Status: DC

## 2024-01-31 NOTE — Patient Instructions (Signed)
 Stay well hydrated. Take the antibiotic twice daily to treat a bladder infection. We are sending off the urine for culture to confirm the infection and to verify that the medication we prescribed is appropriate. Contact us if your symptoms don't improve, especially if you develop fever, vomiting, pain at your kidneys, or other concerns.

## 2024-02-03 ENCOUNTER — Encounter: Payer: Self-pay | Admitting: Family Medicine

## 2024-02-03 LAB — URINE CULTURE

## 2024-02-12 NOTE — Telephone Encounter (Signed)
 Called pt reached vm lmtrc re follow up appt.  Sent my chart message also.

## 2024-02-12 NOTE — Progress Notes (Unsigned)
 No chief complaint on file.  Patient presents to f/u on recent UTI. She reports ongoing burning in her private area when she urinates, on the right side.  She was seen by me on 3/20 with burning with urination off/on for 10-14 days. She had discomfort by the urethra, no abdominal pain, some intermittent ache at the flanks. Some chills, no fever. She also had reported having an ongoing rash in her genital area, which was improving using a cream from the dermatologist. This wasn't evaluated at that visit. She has psoriasis, and is improving with prescribed treatments.  Urine culture grew Raoultella ornithinolytica, >100K CFU, which was sensitive to the prescribed nitrofurantoin.   PMH, PSH, SH reviewed   ROS:    PHYSICAL EXAM:  There were no vitals taken for this visit.      ASSESSMENT/PLAN:  Needs repeat urine, and to be undressed from waist down to evaluate her area of discomfort (genital area).

## 2024-02-13 ENCOUNTER — Encounter: Payer: Self-pay | Admitting: Family Medicine

## 2024-02-13 ENCOUNTER — Ambulatory Visit: Admitting: Family Medicine

## 2024-02-13 VITALS — BP 132/82 | HR 80 | Temp 99.4°F | Ht 63.0 in | Wt 205.4 lb

## 2024-02-13 DIAGNOSIS — N3000 Acute cystitis without hematuria: Secondary | ICD-10-CM

## 2024-02-13 DIAGNOSIS — L409 Psoriasis, unspecified: Secondary | ICD-10-CM

## 2024-02-13 DIAGNOSIS — R300 Dysuria: Secondary | ICD-10-CM | POA: Diagnosis not present

## 2024-02-13 LAB — POCT URINALYSIS DIP (PROADVANTAGE DEVICE)
Bilirubin, UA: NEGATIVE
Blood, UA: NEGATIVE
Glucose, UA: NEGATIVE mg/dL
Ketones, POC UA: NEGATIVE mg/dL
Nitrite, UA: NEGATIVE
Protein Ur, POC: NEGATIVE mg/dL
Specific Gravity, Urine: 1.01
Urobilinogen, Ur: 0.2
pH, UA: 6 (ref 5.0–8.0)

## 2024-02-13 NOTE — Patient Instructions (Signed)
  Use bacitracin or other topical antibacterial ointment to the red areas on your inner right thigh. Keep these covered with a bandage--this will prevent irritation from urine, or rubbing. If this isn't improving, please follow up with your dermatologist.  We are sending the urine for culture since the urine specimen wasn't completely normal, to ensure that there isn't any ongoing infection.

## 2024-02-17 ENCOUNTER — Encounter: Payer: Self-pay | Admitting: Family Medicine

## 2024-02-17 LAB — URINE CULTURE

## 2024-02-17 MED ORDER — NITROFURANTOIN MONOHYD MACRO 100 MG PO CAPS: 100.0000 mg | ORAL_CAPSULE | Freq: Two times a day (BID) | ORAL | 0 refills | Status: DC

## 2024-02-17 NOTE — Addendum Note (Signed)
 Addended by: Joselyn Arrow on: 02/17/2024 01:41 PM   Modules accepted: Orders

## 2024-02-19 DIAGNOSIS — L400 Psoriasis vulgaris: Secondary | ICD-10-CM | POA: Diagnosis not present

## 2024-02-28 ENCOUNTER — Telehealth: Payer: Self-pay | Admitting: Family Medicine

## 2024-02-28 NOTE — Telephone Encounter (Signed)
 See Dr. Monnie Anthony message

## 2024-02-28 NOTE — Telephone Encounter (Signed)
 She should follow up with PCP if ongoing urinary symptoms. She doesn't need a routine check if symptoms resolved.

## 2024-02-28 NOTE — Telephone Encounter (Signed)
 Left message for patient

## 2024-02-28 NOTE — Telephone Encounter (Signed)
 Copied from CRM [id number]. Topic: General - Other >> Feb 28, 2024 10:12 AM Chuck Crater wrote: Reason for CRM: Patient just finished her medication for UTI and she wants to know if she has to come in next week to retest.

## 2024-03-04 NOTE — Progress Notes (Unsigned)
 No chief complaint on file.   Last seen by me 02/13/2024 to f/u on UTI in March, with ongoing burning in her private area when she urinates, on the right side. Urine at f/u visit showed 2+ leuks.  She was also noted to have a quarter-sized and a nickel sized erythematous plaque at the upper inner R thigh. No crusting or surrounding erythema or soft tissue swelling was noted.   Urine culture showed 25-50K  Enterococcus faecalis and 10-25K E.coli. She was advised that this may not truly represent urinary infections (that pain was likely from the skin lesions, not a bladder infection).  Advised that if she was having urgency, frequency and pain in abdomen/urethral area, that she should take another course of antibiotics, and was sent in a prescription for nitrofurantoin . She was advised to follow-up with Dr. Robina Chol for ongoing issues.   She was previously seen by me on 3/20 with burning with urination off/on for 10-14 days. She had discomfort by the urethra, no abdominal pain, some intermittent ache at the flanks. Some chills, no fever. She also had reported having an ongoing rash in her genital area, which was improving using a cream from the dermatologist. This wasn't evaluated at that visit. She has psoriasis, and is improving with prescribed treatments. That urine culture grew Raoultella ornithinolytica, >100K CFU, which was sensitive to the prescribed nitrofurantoin .      PMH, PSH, SH reviewed   ROS:    PHYSICAL EXAM:  There were no vitals taken for this visit.      ASSESSMENT/PLAN:   Needs pelvic exam. If ongoing urinary complaints, needs urine also  Not sure what her ongoing issues are. Not sure why she is seeing me again, for the 3rd time for this, when she was advised that she needs to see her PCP.

## 2024-03-05 ENCOUNTER — Ambulatory Visit (INDEPENDENT_AMBULATORY_CARE_PROVIDER_SITE_OTHER): Admitting: Family Medicine

## 2024-03-05 ENCOUNTER — Encounter: Payer: Self-pay | Admitting: Family Medicine

## 2024-03-05 VITALS — BP 150/80 | HR 84 | Temp 99.2°F | Ht 63.0 in | Wt 204.0 lb

## 2024-03-05 DIAGNOSIS — R300 Dysuria: Secondary | ICD-10-CM

## 2024-03-05 DIAGNOSIS — L409 Psoriasis, unspecified: Secondary | ICD-10-CM

## 2024-03-05 DIAGNOSIS — R030 Elevated blood-pressure reading, without diagnosis of hypertension: Secondary | ICD-10-CM | POA: Diagnosis not present

## 2024-03-05 LAB — POCT URINALYSIS DIP (PROADVANTAGE DEVICE)
Bilirubin, UA: NEGATIVE
Blood, UA: NEGATIVE
Glucose, UA: NEGATIVE mg/dL
Ketones, POC UA: NEGATIVE mg/dL
Nitrite, UA: NEGATIVE
Protein Ur, POC: NEGATIVE mg/dL
Specific Gravity, Urine: 1.01
Urobilinogen, Ur: 0.2
pH, UA: 6 (ref 5.0–8.0)

## 2024-03-05 NOTE — Patient Instructions (Addendum)
  Please limit the sodium in your diet. Avoid soups, canned foods, pickles, olives, ham, and other lunchmeats and processed meats (bacon, sausage). Monitor your blood pressure regularly. Bring in the list of blood pressures and come see Dr. Robina Chol if your blood pressure stays over 140/90.  Normal BP is ideally under 130/80. Daily exercise and further weight loss will also help keep the blood pressure down.   I recommend following up with your dermatologist sooner than June, for further treatment of the skin lesions at your right upper/inner thigh that do not seem to be healing.  There is no evidence to suggest any ongoing urinary infection. If you develop increasing pain when emptying your bladder (at the area where the urine leaves your body, or lower abdominal pain, not external pain), or if you have increased urgency, frequency or cloudy or bloody urine, then return for re-evaluation.

## 2024-03-25 ENCOUNTER — Ambulatory Visit (INDEPENDENT_AMBULATORY_CARE_PROVIDER_SITE_OTHER): Admitting: Medical

## 2024-03-25 VITALS — BP 134/80 | HR 68 | Wt 203.6 lb

## 2024-03-25 DIAGNOSIS — R300 Dysuria: Secondary | ICD-10-CM | POA: Diagnosis not present

## 2024-03-25 DIAGNOSIS — N952 Postmenopausal atrophic vaginitis: Secondary | ICD-10-CM

## 2024-03-25 DIAGNOSIS — N390 Urinary tract infection, site not specified: Secondary | ICD-10-CM

## 2024-03-25 LAB — POCT WET PREP (WET MOUNT)
Clue Cells Wet Prep Whiff POC: NEGATIVE
Trichomonas Wet Prep HPF POC: ABSENT

## 2024-03-25 LAB — POCT URINALYSIS DIP (PROADVANTAGE DEVICE)
Bilirubin, UA: NEGATIVE
Blood, UA: NEGATIVE
Glucose, UA: NEGATIVE mg/dL
Ketones, POC UA: NEGATIVE mg/dL
Nitrite, UA: NEGATIVE
Protein Ur, POC: NEGATIVE mg/dL
Specific Gravity, Urine: 1.01
Urobilinogen, Ur: NEGATIVE
pH, UA: 6 (ref 5.0–8.0)

## 2024-03-25 MED ORDER — PREMARIN 0.625 MG/GM VA CREA: 1.0000 | TOPICAL_CREAM | VAGINAL | 1 refills | Status: DC

## 2024-03-25 NOTE — Progress Notes (Signed)
 Subjective:  Kathy Freeman is a 83 y.o. female who presents for Chief Complaint  Patient presents with   Dysuria    Burning with urination, symptoms started several weeks ago. Never seen specialist but would like to be referred.      Here for possible UTI.  She notes a few days of burning with urination, urine urgency, pain in the lower abdomen and some chills.  No fever.  No nausea vomiting or diarrhea.  No recent changes in soap products.  She drinks a good amount of water but also drinks a fair amount of caffeine as well.  No vaginal discharge.  No redness in the vulvar area.  No itching.  No other aggravating or relieving factors.    No other c/o.  Past Medical History:  Diagnosis Date   Allergy    RHINITIS   Arthritis    Colonic polyp    Hemorrhoids    Obesity    Psoriasis    Raynaud disease    Thyroid  disease    HYPOTHYROID   Current Outpatient Medications on File Prior to Visit  Medication Sig Dispense Refill   cyanocobalamin (VITAMIN B12) 1000 MCG tablet Take 1,000 mcg by mouth daily.     ibuprofen (ADVIL) 200 MG tablet Take 400 mg by mouth every 6 (six) hours as needed.     levothyroxine  (SYNTHROID ) 100 MCG tablet TAKE 1 TABLET(100 MCG) BY MOUTH DAILY 90 tablet 0   loratadine (CLARITIN) 10 MG tablet Take 10 mg by mouth daily.     Multiple Vitamins-Minerals (MULTIVITAMIN WITH MINERALS) tablet Take 1 tablet by mouth daily.     traMADol  (ULTRAM ) 50 MG tablet Take 50 mg by mouth every 6 (six) hours as needed.     Vitamin D, Ergocalciferol, 50 MCG (2000 UT) CAPS Take 1 capsule by mouth daily.     betamethasone  valerate ointment (VALISONE ) 0.1 % Apply 1 Application topically 2 (two) times daily. PSORIASIS (Patient not taking: Reported on 03/25/2024) 45 g 0   clotrimazole -betamethasone  (LOTRISONE ) cream Apply 1 Application topically daily. FUNGUS (Patient not taking: Reported on 03/25/2024) 30 g 0   diclofenac  Sodium (VOLTAREN ) 1 % GEL APPLY 2 GRAMS TOPICALLY TO THE AFFECTED  AREA FOUR TIMES DAILY (Patient not taking: Reported on 03/25/2024) 100 g 0   nystatin  (MYCOSTATIN /NYSTOP ) powder Apply 1 Application topically 2 (two) times daily. (Patient not taking: Reported on 02/13/2024) 30 g 1   No current facility-administered medications on file prior to visit.     The following portions of the patient's history were reviewed and updated as appropriate: allergies, current medications, past family history, past medical history, past social history, past surgical history and problem list.  ROS Otherwise as in subjective above    Objective: BP 134/80   Pulse 68   Wt 203 lb 9.6 oz (92.4 kg)   BMI 36.07 kg/m   General appearance: alert, no distress, well developed, well nourished Abdomen: +bs, soft, mild suprapubic tendnerss, otherwise  non tender, non distended, no masses, no hepatomegaly, no splenomegaly    Assessment: Encounter Diagnoses  Name Primary?   Recurrent urinary tract infection Yes   Burning with urination    Atrophic vaginitis      Plan: We discussed her symptoms and concerns.  I saw her several times in the fall 2024 for recurrent urinary tract infection symptoms.  She did have some positive cultures.  She has seen Dr. Monnie Anthony recently in the last few months on multiple visits for similar symptoms as  well.   She did have a positive urine culture in March and April but other times her urinalysis did not show infection.  Today the urinalysis just showed some leukocytes.  We did a self wet prep today that was also normal  I suspect atrophic vaginitis may be causing some of her symptoms.  I will ask her to do a trial of some topical hormone cream and we will go ahead and make referral to urology as well due to recurrent symptoms.   [name] was seen today for dysuria.  Diagnoses and all orders for this visit:  Recurrent urinary tract infection -     Ambulatory referral to Urology -     Urine Culture  Burning with urination -     Ambulatory  referral to Urology -     POCT Urinalysis DIP (Proadvantage Device) -     POCT Wet Prep [facility]) -     Urine Culture  Atrophic vaginitis  Other orders -     conjugated estrogens (PREMARIN) vaginal cream; Place 1 Applicatorful vaginally 3 (three) times a week.    Follow up: With urology, referral placed today

## 2024-03-25 NOTE — Progress Notes (Signed)
 Left message for pt to call me back

## 2024-03-27 ENCOUNTER — Ambulatory Visit: Payer: Self-pay | Admitting: Medical

## 2024-03-27 ENCOUNTER — Other Ambulatory Visit: Payer: Self-pay | Admitting: Medical

## 2024-03-27 MED ORDER — NITROFURANTOIN MONOHYD MACRO 100 MG PO CAPS: 100.0000 mg | ORAL_CAPSULE | Freq: Two times a day (BID) | ORAL | 0 refills | Status: DC

## 2024-03-27 NOTE — Telephone Encounter (Signed)
 Left message for pt to call back

## 2024-03-27 NOTE — Progress Notes (Signed)
 The preliminary urine culture results show infection.  Use the antibiotic macrobid .  We will call when final culture results are available in the next few days.

## 2024-03-27 NOTE — Telephone Encounter (Signed)
-----   Message from Lovett Ruck sent at 03/25/2024 11:59 AM EDT ----- Please call her back.  She left before I went back to talk to her.  The urine and the vaginal swab did not really show anything today.  I did put in the referral to urology.  She may have just changes associated with age-related tissue changes.  I want her to try Premarin topical vaginal cream 3 days/week for the time being until she sees the urologist.  I suspect the symptoms are related to atrophic changes or not true infection.

## 2024-03-29 LAB — URINE CULTURE

## 2024-03-31 NOTE — Progress Notes (Signed)
 The urine culture was positive for infection, and the bacteria is sensitive to Macrobid .   As long as you are improving, finish the antibiotic as it should work.  If not improving, let me know.   Expect a phone call about urology referral  Try the premarin  vaginal cream 2 days per week vaginally as well

## 2024-04-15 DIAGNOSIS — L249 Irritant contact dermatitis, unspecified cause: Secondary | ICD-10-CM | POA: Diagnosis not present

## 2024-04-15 DIAGNOSIS — L400 Psoriasis vulgaris: Secondary | ICD-10-CM | POA: Diagnosis not present

## 2024-04-26 ENCOUNTER — Other Ambulatory Visit: Payer: Self-pay | Admitting: Family Medicine

## 2024-04-26 DIAGNOSIS — E038 Other specified hypothyroidism: Secondary | ICD-10-CM

## 2024-05-06 DIAGNOSIS — M25561 Pain in right knee: Secondary | ICD-10-CM | POA: Diagnosis not present

## 2024-05-06 DIAGNOSIS — M4186 Other forms of scoliosis, lumbar region: Secondary | ICD-10-CM | POA: Diagnosis not present

## 2024-05-06 DIAGNOSIS — M48062 Spinal stenosis, lumbar region with neurogenic claudication: Secondary | ICD-10-CM | POA: Diagnosis not present

## 2024-05-06 DIAGNOSIS — M51369 Other intervertebral disc degeneration, lumbar region without mention of lumbar back pain or lower extremity pain: Secondary | ICD-10-CM | POA: Diagnosis not present

## 2024-05-06 DIAGNOSIS — M25562 Pain in left knee: Secondary | ICD-10-CM | POA: Diagnosis not present

## 2024-05-06 NOTE — Progress Notes (Signed)
 Chief Complaint: Recurrent urinary tract infections  History of Present Illness:  Kathy Freeman is a 83 y.o. female who is seen in consultation from Joyce Norleen BROCKS, MD for evaluation of recurrent urinary tract infections.  Typically, symptoms include dysuria, cloudy and strong smelling urine.  No fevers, no gross hematuria.  Typically respond to normal courses of oral antibiotics.  Past 4 documented UTIs: November/2024--pansensitive E. coli March/2025--Raoultella ornithinolytica April/2025--Enterococcus resistant to Cipro  and tetracycline May 13/2025--Raoultella ornithinolytica   Past Medical History:  Past Medical History:  Diagnosis Date   Allergy    RHINITIS   Arthritis    Colonic polyp    Hemorrhoids    Obesity    Psoriasis    Raynaud disease    Thyroid  disease    HYPOTHYROID    Past Surgical History:  Past Surgical History:  Procedure Laterality Date   ABDOMINAL HYSTERECTOMY  1985   CHOLECYSTECTOMY     Left ear      Allergies:  Allergies  Allergen Reactions   Acetaminophen  Other (See Comments)    headache   Asa [Aspirin]     Heart flutters.   Sulfa Antibiotics     Skin discoloration    Sterapred [Prednisone] Diarrhea and Palpitations    Family History:  Family History  Problem Relation Age of Onset   Arthritis Mother    Diabetes Mother    Heart disease Mother    Arthritis Father    Cancer Father    Diabetes Father    Hypertension Father    Diabetes Sister    Heart disease Sister    Hypertension Sister    Diabetes Brother    Mental illness Brother    Hypertension Brother     Social History:  Social History   Tobacco Use   Smoking status: Never   Smokeless tobacco: Never  Substance Use Topics   Alcohol use: No   Drug use: No    Review of symptoms:  Constitutional:  Negative for unexplained weight loss, night sweats, fever, chills ENT:  Negative for nose bleeds, sinus pain, painful swallowing CV:  Negative for chest pain,  shortness of breath, exercise intolerance, palpitations, loss of consciousness Resp:  Negative for cough, wheezing, shortness of breath GI:  Negative for nausea, vomiting, diarrhea, bloody stools GU:  Positives noted in HPI; otherwise negative for gross hematuria, dysuria, urinary incontinence Neuro:  Negative for seizures, poor balance, limb weakness, slurred speech Psych:  Negative for lack of energy, depression, anxiety Endocrine:  Negative for polydipsia, polyuria, symptoms of hypoglycemia (dizziness, hunger, sweating) Hematologic:  Negative for anemia, purpura, petechia, prolonged or excessive bleeding, use of anticoagulants  Allergic:  Negative for difficulty breathing or choking as a result of exposure to anything; no shellfish allergy; no allergic response (rash/itch) to materials, foods  Physical exam: There were no vitals taken for this visit. GENERAL APPEARANCE:  Well appearing, well developed, well nourished, NAD HEENT: Atraumatic, Normocephalic. NECK: Normal appearance LUNGS: Normal inspiratory and expiratory excursion HEART: Regular Rate EXTREMITIES: Moves all extremities well.  Without clubbing, cyanosis.  Edema present, venous stasis ulcers noted NEUROLOGIC:  Alert and oriented x 3, normal gait, CN II-XII grossly intact.  MENTAL STATUS:  Appropriate. SKIN:  Warm, dry and intact.    Results:   I have reviewed referring/prior physicians records  I have reviewed urinalysis  I have reviewed prior urine cultures    Assessment: - Recurrent urinary tract infections, currently asymptomatic.  She could not produce the specimen today  -  Atrophic vaginal changes   Plan: -I will start her on Estrace cream.  This will be generic.  The Premarin  that she was prescribed will cost her over $300  -I will start her on Macrodantin  50 mg at night for 3 months  -Follow-up here in 3 months

## 2024-05-07 ENCOUNTER — Ambulatory Visit: Admitting: Urology

## 2024-05-07 VITALS — BP 147/89 | HR 101 | Ht 63.0 in | Wt 205.0 lb

## 2024-05-07 DIAGNOSIS — N952 Postmenopausal atrophic vaginitis: Secondary | ICD-10-CM

## 2024-05-07 DIAGNOSIS — R300 Dysuria: Secondary | ICD-10-CM

## 2024-05-07 DIAGNOSIS — Z87440 Personal history of urinary (tract) infections: Secondary | ICD-10-CM

## 2024-05-07 DIAGNOSIS — N3020 Other chronic cystitis without hematuria: Secondary | ICD-10-CM

## 2024-05-07 LAB — BLADDER SCAN AMB NON-IMAGING: Scan Result: 30

## 2024-05-07 MED ORDER — ESTRADIOL 0.1 MG/GM VA CREA
TOPICAL_CREAM | VAGINAL | 3 refills | Status: AC
Start: 2024-05-07 — End: ?

## 2024-05-07 MED ORDER — NITROFURANTOIN MACROCRYSTAL 50 MG PO CAPS: 50.0000 mg | ORAL_CAPSULE | Freq: Every day | ORAL | 3 refills | Status: DC

## 2024-05-21 ENCOUNTER — Ambulatory Visit: Payer: Self-pay

## 2024-05-21 NOTE — Telephone Encounter (Signed)
  MAIN SYMPTOM: What is the main symptom you are concerned about? (e.g., painful urination, urine frequency) burning 2. BETTER-SAME-WORSE: Are you getting better, staying the same, or getting worse compared to how you felt at your last visit to the doctor (most recent medical visit)? 3. PAIN: How bad is the pain? (e.g., Scale 1-10; mild, moderate, or severe) - MILD (1-3): complains slightly about urination hurting - MODERATE (4-7): interferes with normal activities  - SEVERE (8-10): excruciating, unwilling or unable to urinate because of the pain  4. FEVER: Do you have a fever? If Yes, ask: What is it, how was it measured, and when did it start? 5. OTHER SYMPTOMS: Do you have any other symptoms? (e.g., blood in the urine, flank pain, vaginal discharge) 6. DIAGNOSIS: When was the UTI diagnosed? By whom? Was it a kidney infection, bladder infection or both? 7. ANTIBIOTIC: What antibiotic(s) are you taking? How many times per day? 8. ANTIBIOTIC - START DATE: When did you start taking the antibiotic? Macrodantin  6/25   Urine is irritating vaginal area during night.      Copied from CRM [id number]. Topic: Clinical - Red Word Triage >> May 21, 2024  2:11 PM Marissa P wrote: Red Word that prompted transfer to Nurse Triage: Patient has a uti and lots of pain. Been going on for 4 months now, needs help please.

## 2024-05-21 NOTE — Telephone Encounter (Signed)
 Needs appt

## 2024-05-21 NOTE — Telephone Encounter (Signed)
 FYI Only or Action Required?: Action required by provider: no PCP availability within disposition, needs earlier appt if possible, advised call urologist or go to UC in meantime.  Patient was last seen in primary care on 03/25/2024 by Kathy Alm RAMAN, PA-C.  Called Nurse Triage reporting Dysuria, Excessive Sweating, Rash, Back Pain, Chills, and vulvar pain.  Symptoms began several weeks ago.  Interventions attempted: Prescription medications: macrodantin , Rest, hydration, or home remedies, and Ice/heat application.  Symptoms are: gradually worsening.  Triage Disposition: See HCP Within 4 Hours (Or PCP Triage)  Patient/caregiver understands and will follow disposition?: Unsure      Advised pt be examined in next 4 hours, no feasible appt with PCP office in that time period, advised pt call urologist to see if can be seen asap or have further recommendations, advised UC if no availability with urologist  Initial Assessment Questions 1. MAIN SYMPTOM: What is the main symptom you are concerned about? (e.g., painful urination, urine frequency)    Pain with urination, urine comes out and burns the what I call my cheek inside my leg, irritates all my area Went to urinary doc and said had infection didn't say what to do about them, gave pills and sent me home Never had an infection like this that kept going and going and getting worse and not knowing what's causing it Only time get any urine out is when take the pill, don't just pee on my own, macrodantin  Back pain partly because arthritis and partly because of this, started in the last day or so, and chills sometimes, not taken temp, doing some heavy sweating even in [facility] resting  2. BETTER-SAME-WORSE: Are you getting better, staying the same, or getting worse compared to how you felt at your last visit to the doctor (most recent medical visit)?    Not getting better, worse since new onset back pain  3. PAIN: How bad is the pain?  (e.g., Scale 1-10; mild, moderate, or severe) - MILD (1-3): complains slightly about urination hurting - MODERATE (4-7): interferes with normal activities - SEVERE (8-10): excruciating, unwilling or unable to urinate because of the pain     5-6/10 pain in back Have to keep cold air on them (cheeks genital area) all the time when at home, keep fan on them all the time to relieve the burn, not hurting now, doesn't hurt to pee just hurts my skin, pt does not confirm pattern of pain  4. FEVER: Do you have a fever? If Yes, ask: What is it, how was it measured, and when did it start?      chills sometimes, not taken temp, doing some heavy sweating even in AC resting 5. OTHER SYMPTOMS: Do you have any other symptoms? (e.g., blood in the urine, flank pain, vaginal discharge) No nausea, chest pain, abdominal pain, SOB, or cold/pale/clammy skin balance is kind of wobbly but not new, use walking stick No blood in urine     Rash is hurting, red, 3 psoriasis things, 2 on one side of my cheek and 1 on other side of my cheek, very aggravated, tried to cover up with cream to help not get urine on them but doesn't hurt  6. DIAGNOSIS: When was the UTI diagnosed? By whom? Was it a kidney infection, bladder infection or both?     Urology at [facility] Not called urology to follow up, said to call PCP if any problems  7. ANTIBIOTIC: What antibiotic(s) are you taking? How many times per  day?      Macrodantin  50 mg at night for 3 months No improvement  Reason for Disposition  [1] Side (flank) or lower back pain AND [2] new-onset since starting antibiotics  Protocols used: Urinary Tract Infection on Antibiotic Follow-up Call - [facility]

## 2024-05-22 ENCOUNTER — Ambulatory Visit: Admitting: Family Medicine

## 2024-05-22 NOTE — Telephone Encounter (Signed)
 Left message for pt to call back for appointment for urinary issues

## 2024-05-26 ENCOUNTER — Ambulatory Visit (INDEPENDENT_AMBULATORY_CARE_PROVIDER_SITE_OTHER): Admitting: Medical

## 2024-05-26 ENCOUNTER — Ambulatory Visit: Payer: Self-pay

## 2024-05-26 VITALS — BP 120/80 | HR 86 | Temp 98.8°F | Wt 204.0 lb

## 2024-05-26 DIAGNOSIS — L409 Psoriasis, unspecified: Secondary | ICD-10-CM | POA: Diagnosis not present

## 2024-05-26 DIAGNOSIS — R32 Unspecified urinary incontinence: Secondary | ICD-10-CM | POA: Diagnosis not present

## 2024-05-26 DIAGNOSIS — R300 Dysuria: Secondary | ICD-10-CM

## 2024-05-26 DIAGNOSIS — N390 Urinary tract infection, site not specified: Secondary | ICD-10-CM

## 2024-05-26 DIAGNOSIS — N3946 Mixed incontinence: Secondary | ICD-10-CM

## 2024-05-26 LAB — POCT URINALYSIS DIP (PROADVANTAGE DEVICE)
Bilirubin, UA: NEGATIVE
Blood, UA: NEGATIVE
Glucose, UA: NEGATIVE mg/dL
Ketones, POC UA: NEGATIVE mg/dL
Nitrite, UA: NEGATIVE
Protein Ur, POC: NEGATIVE mg/dL
Specific Gravity, Urine: 1.01
Urobilinogen, Ur: NEGATIVE
pH, UA: 6 (ref 5.0–8.0)

## 2024-05-26 MED ORDER — SOLIFENACIN SUCCINATE 5 MG PO TABS
5.0000 mg | ORAL_TABLET | Freq: Every day | ORAL | 2 refills | Status: AC
Start: 2024-05-26 — End: ?

## 2024-05-26 MED ORDER — NYSTATIN 100000 UNIT/GM EX POWD: 1.0000 | Freq: Two times a day (BID) | CUTANEOUS | 2 refills | Status: AC

## 2024-05-26 MED ORDER — BETAMETHASONE VALERATE 0.1 % EX OINT: 1.0000 | TOPICAL_OINTMENT | Freq: Two times a day (BID) | CUTANEOUS | 1 refills | Status: AC

## 2024-05-26 NOTE — Progress Notes (Signed)
 Subjective:  Kathy Freeman is a 83 y.o. female who presents for Chief Complaint  Patient presents with   blisters    Blisters on inner thighs. 2 sores on right side and 1 on left side. Went to urologist on 6/25 and was told she had a bladder infection- and was given medication and still on it but urine burns and she can't hold her bladder so she urinates on her self and smells     Here for urinary symptoms.  She notes burning with urination, some incontinence, odor of the urine.  She has some skin irritation on her thighs upper related to the urinary incontinence..   She saw urology for consult recently after we referred her.  She saw them on May 07, 2024.  She was begun on oral tablet and cream   She is out of her nystatin  powder and betamethasone  ointment.  She still has the triamcinolone cream which she is using for psoriasis rash.  She also uses some Vaseline for the rale blistery areas.  No other aggravating or relieving factors.    No other c/o.  Past Medical History:  Diagnosis Date   Allergy    RHINITIS   Arthritis    Colonic polyp    Hemorrhoids    Obesity    Psoriasis    Raynaud disease    Thyroid  disease    HYPOTHYROID   Current Outpatient Medications on File Prior to Visit  Medication Sig Dispense Refill   clotrimazole -betamethasone  (LOTRISONE ) cream Apply 1 Application topically daily. FUNGUS 30 g 0   cyanocobalamin (VITAMIN B12) 1000 MCG tablet Take 1,000 mcg by mouth daily.     diclofenac  Sodium (VOLTAREN ) 1 % GEL APPLY 2 GRAMS TOPICALLY TO THE AFFECTED AREA FOUR TIMES DAILY 100 g 0   estradiol  (ESTRACE ) 0.1 MG/GM vaginal cream Using your index finger, apply a pea-sized amount of cream in your vaginal opening 2 nights a week 42.5 g 3   ibuprofen (ADVIL) 200 MG tablet Take 400 mg by mouth every 6 (six) hours as needed.     levothyroxine  (SYNTHROID ) 100 MCG tablet TAKE 1 TABLET(100 MCG) BY MOUTH DAILY 90 tablet 0   loratadine (CLARITIN) 10 MG tablet Take 10 mg by  mouth daily.     Multiple Vitamins-Minerals (MULTIVITAMIN WITH MINERALS) tablet Take 1 tablet by mouth daily.     nitrofurantoin  (MACRODANTIN ) 50 MG capsule Take 1 capsule (50 mg total) by mouth at bedtime. 30 capsule 3   traMADol  (ULTRAM ) 50 MG tablet Take 50 mg by mouth every 6 (six) hours as needed.     triamcinolone cream (KENALOG) 0.1 % Apply 1 Application topically 2 (two) times daily as needed.     Vitamin D, Ergocalciferol, 50 MCG (2000 UT) CAPS Take 1 capsule by mouth daily.     No current facility-administered medications on file prior to visit.     The following portions of the patient's history were reviewed and updated as appropriate: allergies, current medications, past family history, past medical history, past social history, past surgical history and problem list.  ROS Otherwise as in subjective above    Objective: BP 120/80   Pulse 86   Temp 98.8 F (37.1 C)   Wt 204 lb (92.5 kg)   BMI 36.14 kg/m   General appearance: alert, no distress, well developed, well nourished Abdomen: +bs, soft, non tender, non distended, no masses, no hepatomegaly, no splenomegaly GU: No obvious vulvar/GU abnormal finding Skin: Right inner upper thigh with 2  beefy red raw psoriasis plaques, one approximately 2 cm diameter, one approximately 5 cm in diameter.  Lower legs with several drier crusted or scaly psoriasis plaques    Assessment: Encounter Diagnoses  Name Primary?   Burning with urination Yes   Recurrent urinary tract infection    Mixed stress and urge urinary incontinence    Psoriasis       Plan: We discussed her current symptoms.  Her ongoing UTI symptoms.  I had referred her to urology recently  I reviewed over the urology notes from 05/07/2024 visit.   Assessment was recurrent UTI, atrophic vaginal changes.  At that time she was started on Estrace  cream as the Premarin  cream was too expensive.  She was also started on Macrodantin  50 mg nightly for 3  months.   Psoriasis For your worse blistery psoriasis patches use the stronger betamethasone  ointment You can alternate with some Vaseline as well for those patches For the medium to mild psoriasis rash use the triamcinolone  Recurrent urinary tract infection Your urine today does not look too bad Drink 80 to 100 ounces of water daily Continue the Macrodantin  50 mg daily capsule for prevention.  This was prescribed by the urologist in [facility] Continue the vaginal cream  Atrophic vaginitis Continue the Estrace  vaginal cream 2 times per week.  This was prescribed by the urologist in [facility]  Go ahead and call and schedule follow-up at your dermatology office for recheck on psoriasis  Urinary incontinence Begin trial of Vesicare  5 mg daily by mouth Referral today to physical therapy to help with pelvic floor strengthening   Kathy Freeman was seen today for blisters.  Diagnoses and all orders for this visit:  Burning with urination -     POCT Urinalysis DIP (Proadvantage Device)  Recurrent urinary tract infection -     POCT Urinalysis DIP (Proadvantage Device)  Mixed stress and urge urinary incontinence  Psoriasis  Other orders -     nystatin  (MYCOSTATIN /NYSTOP ) powder; Apply 1 Application topically 2 (two) times daily. -     betamethasone  valerate ointment (VALISONE ) 0.1 %; Apply 1 Application topically 2 (two) times daily. Worse PSORIASIS rash -     solifenacin  (VESICARE ) 5 MG tablet; Take 1 tablet (5 mg total) by mouth daily.    Follow up: With dermatology, urology

## 2024-05-26 NOTE — Telephone Encounter (Signed)
  FYI Only or Action Required?: FYI only for provider.  Patient was last seen in primary care on 03/25/2024 by Bulah Alm RAMAN, PA-C.  Called Nurse Triage reporting Recurrent UTI.  Symptoms began about a month ago.  Interventions attempted: Nothing.  Symptoms are: gradually worsening.  Triage Disposition: No disposition on file.  Patient/caregiver understands and will follow disposition?:    Copied from CRM [id number]. Topic: Clinical - Red Word Triage >> May 26, 2024  8:37 AM Kevelyn M wrote: Red Word that prompted transfer to Nurse Triage: Patient still has a bladder infection and her urine is aggravating blisters in genital area. She's been using ice packs and took Advil yesterday. Reason for Disposition  [1] SEVERE pain with urination (e.g., excruciating) AND [2] not improved after 2 hours of pain medicine  Answer Assessment - Initial Assessment Questions 1. SEVERITY: How bad is the pain?  (e.g., Scale 1-10; mild, moderate, or severe)     severe 2. FREQUENCY: How many times have you had painful urination today?      Urine hits sores and causes pain 3. PATTERN: Is pain present every time you urinate or just sometimes?      Every time 4. ONSET: When did the painful urination start?      It's been a while, a month or so ago 5. FEVER: Do you have a fever? If Yes, ask: What is your temperature, how was it measured, and when did it start?     Light fever and a chill, dry lips 6. PAST UTI: Have you had a urine infection before? If Yes, ask: When was the last time? and What happened that time?      yes 7. CAUSE: What do you think is causing the painful urination?  (e.g., UTI, scratch, Herpes sore)     UTI, psorisis 8. OTHER SYMPTOMS: Do you have any other symptoms? (e.g., blood in urine, flank pain, genital sores, urgency, vaginal discharge)     Genital sores 9. PREGNANCY: Is there any chance you are pregnant? When was your last menstrual period?      na  Protocols used: Urination Pain - Female-A-AH

## 2024-05-26 NOTE — Patient Instructions (Signed)
 Psoriasis For your worse blistery psoriasis patches use the stronger betamethasone  ointment You can alternate with some Vaseline as well for those patches For the medium to mild psoriasis rash use the triamcinolone  Recurrent urinary tract infection Your urine today does not look too bad Drink 80 to 100 ounces of water daily Continue the Macrodantin  50 mg daily capsule for prevention.  This was prescribed by the urologist in [facility] Continue the vaginal cream  Atrophic vaginitis Continue the Estrace  vaginal cream 2 times per week.  This was prescribed by the urologist in [facility]  Go ahead and call and schedule follow-up at your dermatology office for recheck on psoriasis  Urinary incontinence Begin trial of Vesicare  5 mg daily by mouth Referral today to physical therapy to help with pelvic floor strengthening

## 2024-05-27 NOTE — Progress Notes (Signed)
 Placed referral

## 2024-05-27 NOTE — Addendum Note (Signed)
 Addended by: VICCI HUSBAND A on: 05/27/2024 10:35 AM   Modules accepted: Orders

## 2024-07-09 DIAGNOSIS — N816 Rectocele: Secondary | ICD-10-CM | POA: Diagnosis not present

## 2024-07-09 DIAGNOSIS — N3020 Other chronic cystitis without hematuria: Secondary | ICD-10-CM | POA: Diagnosis not present

## 2024-07-15 DIAGNOSIS — H25813 Combined forms of age-related cataract, bilateral: Secondary | ICD-10-CM | POA: Diagnosis not present

## 2024-07-15 DIAGNOSIS — H52203 Unspecified astigmatism, bilateral: Secondary | ICD-10-CM | POA: Diagnosis not present

## 2024-07-15 DIAGNOSIS — H35363 Drusen (degenerative) of macula, bilateral: Secondary | ICD-10-CM | POA: Diagnosis not present

## 2024-07-31 ENCOUNTER — Encounter (HOSPITAL_BASED_OUTPATIENT_CLINIC_OR_DEPARTMENT_OTHER): Attending: Internal Medicine | Admitting: Internal Medicine

## 2024-07-31 DIAGNOSIS — L400 Psoriasis vulgaris: Secondary | ICD-10-CM | POA: Insufficient documentation

## 2024-07-31 DIAGNOSIS — L97122 Non-pressure chronic ulcer of left thigh with fat layer exposed: Secondary | ICD-10-CM | POA: Insufficient documentation

## 2024-07-31 DIAGNOSIS — L97112 Non-pressure chronic ulcer of right thigh with fat layer exposed: Secondary | ICD-10-CM | POA: Insufficient documentation

## 2024-08-06 ENCOUNTER — Encounter (HOSPITAL_BASED_OUTPATIENT_CLINIC_OR_DEPARTMENT_OTHER): Admitting: Internal Medicine

## 2024-08-06 ENCOUNTER — Ambulatory Visit: Admitting: Urology

## 2024-08-07 ENCOUNTER — Encounter (HOSPITAL_BASED_OUTPATIENT_CLINIC_OR_DEPARTMENT_OTHER): Admitting: Internal Medicine

## 2024-08-07 DIAGNOSIS — L97122 Non-pressure chronic ulcer of left thigh with fat layer exposed: Secondary | ICD-10-CM | POA: Diagnosis not present

## 2024-08-07 DIAGNOSIS — L97112 Non-pressure chronic ulcer of right thigh with fat layer exposed: Secondary | ICD-10-CM

## 2024-08-07 DIAGNOSIS — L400 Psoriasis vulgaris: Secondary | ICD-10-CM | POA: Diagnosis not present

## 2024-08-09 ENCOUNTER — Other Ambulatory Visit: Payer: Self-pay | Admitting: Family Medicine

## 2024-08-09 DIAGNOSIS — E038 Other specified hypothyroidism: Secondary | ICD-10-CM

## 2024-08-12 ENCOUNTER — Ambulatory Visit

## 2024-08-21 ENCOUNTER — Encounter (HOSPITAL_BASED_OUTPATIENT_CLINIC_OR_DEPARTMENT_OTHER): Attending: Internal Medicine | Admitting: Internal Medicine

## 2024-08-21 DIAGNOSIS — L97112 Non-pressure chronic ulcer of right thigh with fat layer exposed: Secondary | ICD-10-CM

## 2024-08-21 DIAGNOSIS — L400 Psoriasis vulgaris: Secondary | ICD-10-CM

## 2024-08-21 DIAGNOSIS — L97122 Non-pressure chronic ulcer of left thigh with fat layer exposed: Secondary | ICD-10-CM

## 2024-09-18 ENCOUNTER — Encounter (HOSPITAL_BASED_OUTPATIENT_CLINIC_OR_DEPARTMENT_OTHER): Attending: Internal Medicine | Admitting: Internal Medicine

## 2024-09-18 DIAGNOSIS — L97122 Non-pressure chronic ulcer of left thigh with fat layer exposed: Secondary | ICD-10-CM | POA: Diagnosis not present

## 2024-09-18 DIAGNOSIS — L400 Psoriasis vulgaris: Secondary | ICD-10-CM

## 2024-09-18 DIAGNOSIS — L97112 Non-pressure chronic ulcer of right thigh with fat layer exposed: Secondary | ICD-10-CM

## 2024-10-17 ENCOUNTER — Encounter (HOSPITAL_BASED_OUTPATIENT_CLINIC_OR_DEPARTMENT_OTHER): Admitting: Internal Medicine

## 2024-10-22 ENCOUNTER — Encounter (HOSPITAL_BASED_OUTPATIENT_CLINIC_OR_DEPARTMENT_OTHER): Attending: Internal Medicine | Admitting: Internal Medicine

## 2024-10-22 DIAGNOSIS — L97122 Non-pressure chronic ulcer of left thigh with fat layer exposed: Secondary | ICD-10-CM | POA: Diagnosis not present

## 2024-10-22 DIAGNOSIS — L400 Psoriasis vulgaris: Secondary | ICD-10-CM

## 2024-10-22 DIAGNOSIS — L97112 Non-pressure chronic ulcer of right thigh with fat layer exposed: Secondary | ICD-10-CM | POA: Insufficient documentation

## 2024-11-14 ENCOUNTER — Other Ambulatory Visit: Payer: Self-pay | Admitting: Family Medicine

## 2024-11-14 DIAGNOSIS — E038 Other specified hypothyroidism: Secondary | ICD-10-CM

## 2024-11-19 ENCOUNTER — Encounter (HOSPITAL_BASED_OUTPATIENT_CLINIC_OR_DEPARTMENT_OTHER): Attending: Internal Medicine | Admitting: Internal Medicine

## 2024-11-19 DIAGNOSIS — L97122 Non-pressure chronic ulcer of left thigh with fat layer exposed: Secondary | ICD-10-CM | POA: Insufficient documentation

## 2024-11-19 DIAGNOSIS — L400 Psoriasis vulgaris: Secondary | ICD-10-CM | POA: Insufficient documentation

## 2024-11-19 DIAGNOSIS — L97112 Non-pressure chronic ulcer of right thigh with fat layer exposed: Secondary | ICD-10-CM | POA: Insufficient documentation

## 2024-11-19 DIAGNOSIS — Z87440 Personal history of urinary (tract) infections: Secondary | ICD-10-CM | POA: Diagnosis not present

## 2024-11-26 ENCOUNTER — Encounter: Payer: Self-pay | Admitting: Family Medicine

## 2024-11-26 ENCOUNTER — Ambulatory Visit (INDEPENDENT_AMBULATORY_CARE_PROVIDER_SITE_OTHER): Admitting: Family Medicine

## 2024-11-26 VITALS — BP 144/94 | HR 79 | Ht 63.0 in | Wt 202.2 lb

## 2024-11-26 DIAGNOSIS — L409 Psoriasis, unspecified: Secondary | ICD-10-CM | POA: Diagnosis not present

## 2024-11-26 DIAGNOSIS — Z2821 Immunization not carried out because of patient refusal: Secondary | ICD-10-CM

## 2024-11-26 DIAGNOSIS — S31809A Unspecified open wound of unspecified buttock, initial encounter: Secondary | ICD-10-CM

## 2024-11-26 DIAGNOSIS — E785 Hyperlipidemia, unspecified: Secondary | ICD-10-CM | POA: Diagnosis not present

## 2024-11-26 DIAGNOSIS — E038 Other specified hypothyroidism: Secondary | ICD-10-CM

## 2024-11-26 DIAGNOSIS — M1990 Unspecified osteoarthritis, unspecified site: Secondary | ICD-10-CM

## 2024-11-26 LAB — CBC WITH DIFFERENTIAL/PLATELET
Basophils Absolute: 0.1 x10E3/uL (ref 0.0–0.2)
Basos: 1 %
EOS (ABSOLUTE): 0.2 x10E3/uL (ref 0.0–0.4)
Eos: 3 %
Hematocrit: 38.6 % (ref 34.0–46.6)
Hemoglobin: 12.2 g/dL (ref 11.1–15.9)
Immature Grans (Abs): 0 x10E3/uL (ref 0.0–0.1)
Immature Granulocytes: 0 %
Lymphocytes Absolute: 1.2 x10E3/uL (ref 0.7–3.1)
Lymphs: 16 %
MCH: 28.3 pg (ref 26.6–33.0)
MCHC: 31.6 g/dL (ref 31.5–35.7)
MCV: 90 fL (ref 79–97)
Monocytes Absolute: 0.4 x10E3/uL (ref 0.1–0.9)
Monocytes: 5 %
Neutrophils Absolute: 5.7 x10E3/uL (ref 1.4–7.0)
Neutrophils: 75 %
Platelets: 309 x10E3/uL (ref 150–450)
RBC: 4.31 x10E6/uL (ref 3.77–5.28)
RDW: 13 % (ref 11.7–15.4)
WBC: 7.6 x10E3/uL (ref 3.4–10.8)

## 2024-11-26 LAB — COMPREHENSIVE METABOLIC PANEL WITH GFR
ALT: 10 IU/L (ref 0–32)
AST: 19 IU/L (ref 0–40)
Albumin: 4 g/dL (ref 3.7–4.7)
Alkaline Phosphatase: 119 IU/L (ref 48–129)
BUN/Creatinine Ratio: 15 (ref 12–28)
BUN: 14 mg/dL (ref 8–27)
Bilirubin Total: 0.4 mg/dL (ref 0.0–1.2)
CO2: 25 mmol/L (ref 20–29)
Calcium: 9.1 mg/dL (ref 8.7–10.3)
Chloride: 102 mmol/L (ref 96–106)
Creatinine, Ser: 0.94 mg/dL (ref 0.57–1.00)
Globulin, Total: 2.4 g/dL (ref 1.5–4.5)
Glucose: 96 mg/dL (ref 70–99)
Potassium: 4.7 mmol/L (ref 3.5–5.2)
Sodium: 142 mmol/L (ref 134–144)
Total Protein: 6.4 g/dL (ref 6.0–8.5)
eGFR: 60 mL/min/1.73

## 2024-11-26 LAB — LIPID PANEL
Chol/HDL Ratio: 3.7 ratio (ref 0.0–4.4)
Cholesterol, Total: 191 mg/dL (ref 100–199)
HDL: 51 mg/dL
LDL Chol Calc (NIH): 121 mg/dL — ABNORMAL HIGH (ref 0–99)
Triglycerides: 105 mg/dL (ref 0–149)
VLDL Cholesterol Cal: 19 mg/dL (ref 5–40)

## 2024-11-26 LAB — TSH: TSH: 1.64 u[IU]/mL (ref 0.450–4.500)

## 2024-11-26 MED ORDER — LEVOTHYROXINE SODIUM 100 MCG PO TABS: 100.0000 ug | ORAL_TABLET | Freq: Every day | ORAL | 3 refills | Status: AC

## 2024-11-26 NOTE — Progress Notes (Signed)
" ° °  Subjective:    Patient ID: Kathy Freeman, female    DOB: 25-May-1941, 84 y.o.   MRN: [id number]  Discussed the use of AI scribe software for clinical note transcription with the patient, who gave verbal consent to proceed.  History of Present Illness   Kathy Freeman is an 84 year old female who presents for medication refill and blood pressure evaluation.  She requires a refill of her thyroid  medication, Levoxyl , as it has been a long time since her last prescription. Her pharmacy indicated the need for verification from her healthcare provider.  During a recent visit to wound care, her blood pressure was elevated, recorded at 144/unknown and previously at 164/unknown during a healthcare visit. She notes that her blood pressure tends to rise when she is nervous in a doctor's office but is normal at home, with a recent reading of 134/unknown.  She has been receiving wound care for three blisters on her gluteal region, described as two large and one small blister, each about two inches across. These blisters have reduced significantly in size. The origin of these blisters is unknown.  She has a history of urinary issues, identified through a urine test about six months ago. She was diagnosed with two different types of infections and was treated with antibiotics, which helped clear her urine. She was seen by a urologist at [facility] for this issue.  She has arthritis in her feet, which has improved. She plans to purchase shoes with good arch support to aid in her condition.  She has a history of a prolapsed bladder and rectum, for which she declined surgical intervention. She manages these conditions with lifestyle modifications, including increased fruit intake, exercise, and hydration.           Review of Systems     Objective:    Physical Exam Alert and in no distress. Tympanic membranes and canals are normal. Pharyngeal area is normal. Neck is supple without adenopathy or thyromegaly.  Cardiac exam shows a regular sinus rhythm without murmurs or gallops. Lungs are clear to auscultation.               Assessment & Plan:  Assessment and Plan    Hypothyroidism Verification of Levoxyl  prescription needed due to lapse. Thyroid  function assessment required for management. - Ordered thyroid  function tests. - Verified Levoxyl  prescription.  Hypertension Home blood pressure normal, elevated in clinic due to white coat syndrome. Home cuff accurate. - Continue current blood pressure management. - Monitor blood pressure at home.  General Health Maintenance Declined influenza and pneumonia vaccinations despite recommendations. - Discussed importance of vaccinations.        "

## 2024-11-27 ENCOUNTER — Ambulatory Visit: Payer: Self-pay | Admitting: Family Medicine

## 2024-12-18 ENCOUNTER — Encounter (HOSPITAL_BASED_OUTPATIENT_CLINIC_OR_DEPARTMENT_OTHER): Admitting: Internal Medicine

## 2024-12-25 ENCOUNTER — Encounter (HOSPITAL_BASED_OUTPATIENT_CLINIC_OR_DEPARTMENT_OTHER): Admitting: Internal Medicine
